# Patient Record
Sex: Female | Born: 1975 | Race: White | Hispanic: No | Marital: Married | Smoking: Never smoker
Health system: Southern US, Community
[De-identification: ages and names within clinical notes are randomized; demographics above are authoritative.]

## PROBLEM LIST (undated history)

## (undated) ENCOUNTER — Inpatient Hospital Stay (HOSPITAL_COMMUNITY): Payer: BC Managed Care – PPO

## (undated) DIAGNOSIS — IMO0001 Reserved for inherently not codable concepts without codable children: Secondary | ICD-10-CM

## (undated) DIAGNOSIS — K219 Gastro-esophageal reflux disease without esophagitis: Secondary | ICD-10-CM

---

## 2003-12-09 ENCOUNTER — Emergency Department (HOSPITAL_COMMUNITY): Admission: EM | Admit: 2003-12-09 | Discharge: 2003-12-09 | Payer: Self-pay | Admitting: Emergency Medicine

## 2004-01-16 ENCOUNTER — Encounter: Admission: RE | Admit: 2004-01-16 | Discharge: 2004-01-16 | Payer: Self-pay | Admitting: Family Medicine

## 2010-09-01 ENCOUNTER — Inpatient Hospital Stay (INDEPENDENT_AMBULATORY_CARE_PROVIDER_SITE_OTHER)
Admission: RE | Admit: 2010-09-01 | Discharge: 2010-09-01 | Disposition: A | Discharge status: Home / Self Care | Payer: BC Managed Care – PPO | Source: Ambulatory Visit | Attending: Family Medicine | Admitting: Family Medicine

## 2010-09-01 ENCOUNTER — Encounter: Payer: Self-pay | Admitting: Family Medicine

## 2010-09-01 DIAGNOSIS — R197 Diarrhea, unspecified: Secondary | ICD-10-CM

## 2010-10-04 ENCOUNTER — Encounter: Payer: Self-pay | Admitting: Family Medicine

## 2010-10-04 ENCOUNTER — Inpatient Hospital Stay (INDEPENDENT_AMBULATORY_CARE_PROVIDER_SITE_OTHER)
Admission: RE | Admit: 2010-10-04 | Discharge: 2010-10-04 | Disposition: A | Discharge status: Home / Self Care | Payer: BC Managed Care – PPO | Source: Ambulatory Visit | Attending: Family Medicine | Admitting: Family Medicine

## 2010-10-04 DIAGNOSIS — J019 Acute sinusitis, unspecified: Secondary | ICD-10-CM | POA: Insufficient documentation

## 2010-10-12 ENCOUNTER — Encounter: Payer: Self-pay | Admitting: Family Medicine

## 2010-10-12 ENCOUNTER — Inpatient Hospital Stay (INDEPENDENT_AMBULATORY_CARE_PROVIDER_SITE_OTHER)
Admission: RE | Admit: 2010-10-12 | Discharge: 2010-10-12 | Disposition: A | Discharge status: Home / Self Care | Payer: BC Managed Care – PPO | Source: Ambulatory Visit | Attending: Family Medicine | Admitting: Family Medicine

## 2010-10-12 ENCOUNTER — Ambulatory Visit
Admission: RE | Admit: 2010-10-12 | Discharge: 2010-10-12 | Disposition: A | Discharge status: Home / Self Care | Payer: BC Managed Care – PPO | Source: Ambulatory Visit | Attending: Family Medicine | Admitting: Family Medicine

## 2010-10-12 ENCOUNTER — Other Ambulatory Visit: Payer: Self-pay | Admitting: Family Medicine

## 2010-10-12 DIAGNOSIS — R0989 Other specified symptoms and signs involving the circulatory and respiratory systems: Secondary | ICD-10-CM

## 2010-10-12 DIAGNOSIS — J209 Acute bronchitis, unspecified: Secondary | ICD-10-CM

## 2010-11-06 ENCOUNTER — Encounter: Payer: Self-pay | Admitting: Emergency Medicine

## 2010-11-06 ENCOUNTER — Inpatient Hospital Stay (INDEPENDENT_AMBULATORY_CARE_PROVIDER_SITE_OTHER)
Admission: RE | Admit: 2010-11-06 | Discharge: 2010-11-06 | Disposition: A | Discharge status: Home / Self Care | Payer: BC Managed Care – PPO | Source: Ambulatory Visit | Attending: Emergency Medicine | Admitting: Emergency Medicine

## 2010-11-06 DIAGNOSIS — R059 Cough, unspecified: Secondary | ICD-10-CM

## 2010-11-06 DIAGNOSIS — R05 Cough: Secondary | ICD-10-CM

## 2010-11-06 DIAGNOSIS — J309 Allergic rhinitis, unspecified: Secondary | ICD-10-CM

## 2010-11-11 ENCOUNTER — Encounter: Payer: Self-pay | Admitting: Family Medicine

## 2010-11-16 ENCOUNTER — Ambulatory Visit (INDEPENDENT_AMBULATORY_CARE_PROVIDER_SITE_OTHER): Payer: BC Managed Care – PPO | Admitting: Family Medicine

## 2010-11-16 ENCOUNTER — Encounter: Payer: Self-pay | Admitting: Family Medicine

## 2010-11-16 VITALS — BP 149/100 | HR 98 | Temp 98.0°F | Ht 63.0 in | Wt 192.0 lb

## 2010-11-16 DIAGNOSIS — J329 Chronic sinusitis, unspecified: Secondary | ICD-10-CM

## 2010-11-16 MED ORDER — METHYLPREDNISOLONE ACETATE 40 MG/ML IJ SUSP
40.0000 mg | Freq: Once | INTRAMUSCULAR | Status: AC
  Administered 2010-11-16: 40 mg via INTRAMUSCULAR

## 2010-11-16 NOTE — Patient Instructions (Signed)
Try the nexium or protonix once a day about 20-30 min before breakfast.   Call me one week.   Acid Reflux (GERD) Acid reflux is also called gastroesophageal reflux disease (GERD). Your stomach makes acid to help digest food. Acid reflux happens when acid from your stomach goes into the tube between your mouth and stomach (esophagus). Your stomach is protected from the acid, but this tube is not. When acid gets into the tube, it may cause a burning feeling in the chest (heartburn). Besides heartburn, other health problems can happen if the acid keeps going into the tube. Some causes of acid reflux include:  Being overweight.   Smoking.   Drinking alcohol.   Eating large meals.   Eating meals and then going to bed right away.   Eating certain foods.   Increased stomach acid production.  HOME CARE  Take all medicine as told by your doctor.   You may need to:   Lose weight.   Avoid alcohol.   Quit smoking.   Do not eat big meals. It is better to eat smaller meals throughout the day.   Do not eat a meal and then nap or go to bed.   Sleep with your head higher than your stomach.   Avoid foods that bother you.   You may need more tests, or you may need to see a special doctor.  GET HELP RIGHT AWAY IF:  You have chest pain that is different than before.   You have pain that goes to your arms, jaw, or between your shoulder blades.   You throw up (vomit) blood, dark brown liquid, or your throw up looks like coffee grounds.   You have trouble swallowing.   You have trouble breathing or cannot stop coughing.   You feel dizzy or pass out.   Your skin is cool, wet, and pale.   Your medicine is not helping.  MAKE SURE YOU:    Understand these instructions.   Will watch your condition.   Will get help right away if you are not doing well or get worse.  Document Released: 08/04/2007 Document Re-Released: 05/12/2009 [facility] Patient Information 2011 [place], [place].

## 2010-11-16 NOTE — Progress Notes (Signed)
Subjective:    Patient ID: Shannon Clarke, female    DOB: June 21, 1975, 35 y.o.   MRN: [id number]  HPI Cough for 2 months. Started with a sinus infection and was on  ABX.  But now still has a cough. Has hydrocodone cough medicine.  Mold in her building.  She gets a tightnesss in her mid chest and radiates into her back.  Was again at [facility] last Friday because cough flares.  Did well as far a her sinus pressure and pain until last night. Neg CXR.  NO hx of phlegm. Gets reflux symptoms about once a week.    Review of Systems  Constitutional: Negative for fever, diaphoresis and unexpected weight change.  HENT: Negative for hearing loss, rhinorrhea and tinnitus.   Eyes: Negative for visual disturbance.  Respiratory: Positive for cough. Negative for wheezing.   Cardiovascular: Positive for chest pain. Negative for palpitations.  Gastrointestinal: Negative for nausea, vomiting, diarrhea and blood in stool.  Genitourinary: Negative for vaginal bleeding, vaginal discharge and difficulty urinating.  Musculoskeletal: Negative for myalgias and arthralgias.  Skin: Negative for rash.  Neurological: Negative for headaches.  Hematological: Negative for adenopathy. Does not bruise/bleed easily.  Psychiatric/Behavioral: Negative for sleep disturbance and dysphoric mood. The patient is not nervous/anxious.        BP 149/100  Pulse 98  Temp(Src) 98 F (36.7 C) (Oral)  Ht 5\' 3"  (1.6 m)  Wt 192 lb (87.091 kg)  BMI 34.01 kg/m2    No Known Allergies  History reviewed. No pertinent past medical history.  History reviewed. No pertinent past surgical history.  History   Social History  . Marital Status: Married    Spouse Name: Chrissie Noa    Number of Children: N/A  . Years of Education: N/A   Occupational History  . HR manager      [employer]   Social History Main Topics  . Smoking status: Never Smoker   . Smokeless tobacco: Not on file  . Alcohol Use: No  . Drug Use: No  . Sexually  Active: Yes -- Female partner(s)   Other Topics Concern  . Not on file   Social History Narrative   No caffeine, no regular exercise.     Family History  Problem Relation Age of Onset  . Hypertension Mother   . Diabetes Maternal Grandmother   . Colon cancer Father 22  . Heart attack Maternal Grandfather   . Heart attack Maternal Grandmother     We administered methylPREDNISolone acetate.  Objective:   Physical Exam  Constitutional: She is oriented to person, place, and time. She appears well-developed and well-nourished.  HENT:  Head: Normocephalic and atraumatic.  Right Ear: External ear normal.  Left Ear: External ear normal.  Nose: Nose normal.  Mouth/Throat: Oropharynx is clear and moist.       TMs and canals are clear.   Eyes: Conjunctivae and EOM are normal. Pupils are equal, round, and reactive to light.  Neck: Neck supple. No thyromegaly present.  Cardiovascular: Normal rate, regular rhythm and normal heart sounds.   Pulmonary/Chest: Breath sounds normal. She has no wheezes.  Lymphadenopathy:    She has no cervical adenopathy.  Neurological: She is alert and oriented to person, place, and time.  Skin: Skin is warm and dry.  Psychiatric: She has a normal mood and affect.          Assessment & Plan:  Cough x 2 months - Recent nl CXR.  Will treat for reflux and  see is sxs improve. Pt has some nexium already at home (hasn't taken it). Call in one week if not better.  Consider 2nd round of ABX, presuming her sinusitis may not be fully cleared, if she is not better on her reflux meds. She never took the zantac,.   Gets flu vaccine through work.

## 2010-11-25 ENCOUNTER — Other Ambulatory Visit: Payer: Self-pay | Admitting: *Deleted

## 2010-11-25 MED ORDER — PANTOPRAZOLE SODIUM 40 MG PO TBEC: 40.0000 mg | DELAYED_RELEASE_TABLET | Freq: Every day | ORAL | Status: DC

## 2011-02-01 NOTE — Progress Notes (Signed)
Summary: Chest Congestion (room 4)   Vital Signs:  Patient Profile:   35 Years Old Female CC:      deep chest congestion; unrelieved since 10-04-10 visit Height:     63 inches Weight:      192.50 pounds O2 Sat:      98 % O2 treatment:    Room Air Temp:     98.2 degrees F oral Pulse rate:   87 / minute Resp:     16 per minute BP sitting:   133 / 83  (left arm) Cuff size:   large  Pt. in pain?   no  Vitals Entered By: Lavell Islam RN (October 12, 2010 8:23 PM)                   Prior Medication List:  ZANTAC 150 MG TABS (RANITIDINE HCL)  AUGMENTIN 875-125 MG TABS (AMOXICILLIN-POT CLAVULANATE) 1 by mouth 2 times daily ALLEGRA-D ALLERGY & CONGESTION 180-240 MG XR24H-TAB (FEXOFENADINE-PSEUDOEPHEDRINE) 1 by mouth q day   Updated Prior Medication List: ZANTAC 150 MG TABS (RANITIDINE HCL)  AUGMENTIN 875-125 MG TABS (AMOXICILLIN-POT CLAVULANATE) 1 by mouth 2 times daily ALLEGRA-D ALLERGY & CONGESTION 180-240 MG XR24H-TAB (FEXOFENADINE-PSEUDOEPHEDRINE) 1 by mouth q day  Current Allergies: No known allergies History of Present Illness Chief Complaint: deep chest congestion; unrelieved since 10-04-10 visit History of Present Illness: Patient w/ adeep cough. Treated for a sinus infection on Sunday 10/04/2010. States still coughing and bringup heavy thick material. Occassional bronchospasm . Still on her Augmentin and the sinus drainage has cleared.   Current Problems: BRONCHITIS, ACUTE (ICD-466.0) ACUTE SINUSITIS, UNSPECIFIED (ICD-461.9)   Current Meds ZANTAC 150 MG TABS (RANITIDINE HCL)  AUGMENTIN 875-125 MG TABS (AMOXICILLIN-POT CLAVULANATE) 1 by mouth 2 times daily ALLEGRA-D ALLERGY & CONGESTION 180-240 MG XR24H-TAB (FEXOFENADINE-PSEUDOEPHEDRINE) 1 by mouth q day ZITHROMAX Z-PAK 250 MG  TABS (AZITHROMYCIN) Use as directed TUSSIONEX PENNKINETIC ER 8-10 MG/5ML LQCR (CHLORPHENIRAMINE-HYDROCODONE) 1 tsp by mouth twice aday  REVIEW OF SYSTEMS Constitutional Symptoms  Complains of fever.     Denies chills, night sweats, weight loss, weight gain, and fatigue.  Eyes       Denies change in vision, eye pain, eye discharge, glasses, contact lenses, and eye surgery. Ear/Nose/Throat/Mouth       Denies hearing loss/aids, change in hearing, ear pain, ear discharge, dizziness, frequent runny nose, frequent nose bleeds, sinus problems, sore throat, hoarseness, and tooth pain or bleeding.  Respiratory       Complains of dry cough.      Denies productive cough, wheezing, shortness of breath, asthma, bronchitis, and emphysema/COPD.      Comments: chest congestion and pain with coughing Cardiovascular       Denies murmurs, chest pain, and tires easily with exhertion.    Gastrointestinal       Denies stomach pain, nausea/vomiting, diarrhea, constipation, blood in bowel movements, and indigestion. Genitourniary       Denies painful urination, kidney stones, and loss of urinary control. Neurological       Denies paralysis, seizures, and fainting/blackouts. Musculoskeletal       Denies muscle pain, joint pain, joint stiffness, decreased range of motion, redness, swelling, muscle weakness, and gout.  Skin       Denies bruising, unusual mles/lumps or sores, and hair/skin or nail changes.  Psych       Denies mood changes, temper/anger issues, anxiety/stress, speech problems, depression, and sleep problems. Other Comments: chest congestion and painful cough x 2-3 weeks despite ABX  Past History:  Family History: Last updated: 09/01/2010 Mother, HTN Father, D, Colon CA age 2  Social History: Last updated: 09/01/2010 No ETOH Non smoker No Drugs [facility]  Past Medical History: Reviewed history from 09/01/2010 and no changes required. Unremarkable  Past Surgical History: Reviewed history from 09/01/2010 and no changes required. Denies surgical history  Family History: Reviewed history from 09/01/2010 and no changes required. Mother, HTN Father, D,  Colon CA age 41  Social History: Reviewed history from 09/01/2010 and no changes required. No ETOH Non smoker No Drugs [facility] Physical Exam General appearance: well developed, well nourished, no acute distress Head: normocephalic, atraumatic Chest/Lungs: no rales, wheezes, or rhonchi bilateral, breath sounds equal without effort Heart: regular rate and  rhythm, no murmur Skin: no obvious rashes or lesions MSE: oriented to time, place, and person Assessment Problems:   ACUTE SINUSITIS, UNSPECIFIED (ICD-461.9) New Problems: BRONCHITIS, ACUTE (ICD-466.0)   Patient Education: Patient and/or caregiver instructed in the following: rest fluids and Tylenol.  Plan New Medications/Changes: Sandria Senter ER 8-10 MG/5ML LQCR (CHLORPHENIRAMINE-HYDROCODONE) 1 tsp by mouth twice aday  #4 fl oz x 0, 10/12/2010, Hassan Rowan MD ZITHROMAX Z-PAK 250 MG  TABS (AZITHROMYCIN) Use as directed  #1 x 0, 10/12/2010, Hassan Rowan MD  New Orders: Est. Patient Level III [16109] T-Chest x-ray, 2 views [71020] Follow Up: Follow up in 2-3 days if no improvement, Follow up on an as needed basis, Follow up with Primary Physician  The patient and/or caregiver has been counseled thoroughly with regard to medications prescribed including dosage, schedule, interactions, rationale for use, and possible side effects and they verbalize understanding.  Diagnoses and expected course of recovery discussed and will return if not improved as expected or if the condition worsens. Patient and/or caregiver verbalized understanding.  Prescriptions: Sandria Senter ER 8-10 MG/5ML LQCR (CHLORPHENIRAMINE-HYDROCODONE) 1 tsp by mouth twice aday  #4 fl oz x 0   Entered and Authorized by:   Hassan Rowan MD   Signed by:   Hassan Rowan MD on 10/12/2010   Method used:   Print then Give to Patient   RxID:   [id number] Z-PAK 250 MG  TABS (AZITHROMYCIN) Use as directed  #1 x 0   Entered and Authorized  by:   Hassan Rowan MD   Signed by:   Hassan Rowan MD on 10/12/2010   Method used:   Print then Give to Patient   RxID:   [id number]   Patient Instructions: 1)  Please schedule a follow-up appointment as needed.3-5 days if not improving 2)  Please schedule an appointment with your primary doctor in : 3)  Take your antibiotic as prescribed until ALL of it is gone, but stop if you develop a rash or swelling and contact our office as soon as possible. 4)  Acute bronchitis symptoms for less than 10 days are not helped by antibiotics. take over the counter cough medications. call if no improvment in  5-7 days, sooner if increasing cough, fever, or new symptoms( shortness of breath, chest pain).  Orders Added: 1)  Est. Patient Level III [44010] 2)  T-Chest x-ray, 2 views [71020]

## 2011-02-01 NOTE — Progress Notes (Signed)
Summary: STOMACH PROBLEMS   Vital Signs:  Patient Profile:   35 Years Old Female CC:      Diarrhea 10 days ago for 3 days then again yesterday LMP:     08/13/2010 Height:     63 inches Weight:      194 pounds O2 Sat:      98 % O2 treatment:    Room Air Temp:     98.6 degrees F oral Pulse rate:   99 / minute Pulse rhythm:   regular Resp:     16 per minute BP sitting:   130 / 94  (left arm) Cuff size:   large  Vitals Entered By: Emilio Math (September 01, 2010 7:21 PM)  Menstrual History: LMP (date): 08/13/2010                History of Present Illness Chief Complaint: Diarrhea 10 days ago for 3 days then again yesterday History of Present Illness:  Subjective:  Patient complains of developing watery diarrhea about 2 weeks ago after eating [place] food that lasted 3 days before resolving.  She had mild nausea but no vomiting.  No abdominal pain.  No fevers, chills, and sweats.  Her bowel movements have become more formed, although still soft, until yesterday when she had several episodes of watery diarrhea.  No diarrhea today.  Mild nausea without vomiting.  She has been fatigued today but no abdominal pain or fever.  No blood in stool.  No resp or GU symptoms.  No recent antibiotic use. No foreign travel.  No untreated water.  She notes that she has been eating cereal with milk over the past several days. Her father had colon cancer.  REVIEW OF SYSTEMS Constitutional Symptoms      Denies fever, chills, night sweats, weight loss, weight gain, and fatigue.  Eyes       Denies change in vision, eye pain, eye discharge, glasses, contact lenses, and eye surgery. Ear/Nose/Throat/Mouth       Denies hearing loss/aids, change in hearing, ear pain, ear discharge, dizziness, frequent runny nose, frequent nose bleeds, sinus problems, sore throat, hoarseness, and tooth pain or bleeding.  Respiratory       Denies dry cough, productive cough, wheezing, shortness of breath, asthma, bronchitis,  and emphysema/COPD.  Cardiovascular       Denies murmurs, chest pain, and tires easily with exhertion.    Gastrointestinal       Complains of stomach pain and diarrhea.      Denies nausea/vomiting, constipation, blood in bowel movements, and indigestion. Genitourniary       Denies painful urination, kidney stones, and loss of urinary control. Neurological       Denies paralysis, seizures, and fainting/blackouts. Musculoskeletal       Denies muscle pain, joint pain, joint stiffness, decreased range of motion, redness, swelling, muscle weakness, and gout.  Skin       Denies bruising, unusual mles/lumps or sores, and hair/skin or nail changes.  Psych       Denies mood changes, temper/anger issues, anxiety/stress, speech problems, depression, and sleep problems.  Past History:  Past Medical History: Unremarkable  Past Surgical History: Denies surgical history  Family History: Mother, HTN Father, D, Colon CA age 5  Social History: No ETOH Non smoker No Drugs [facility]   Objective:  Appearance:  Patient is obese but otherwise appears healthy, stated age, and in no acute distress  Mouth/pharynx:  moist mucous membranes  Neck:  Supple.  No adenopathy is present.  No thyromegaly is present  Lungs:  Clear to auscultation.  Breath sounds are equal.  Heart:  Regular rate and rhythm without murmurs, rubs, or gallops.  Abdomen:  Soft, nontender without masses or hepatosplenomegaly.  Bowel sounds are present.  No CVA or flank tenderness.  Extremities:  No edema.   Skin:  No rash CBC:  WBC 9.3 ; LY 29.0, MO 6.4, GR 64.6; Hgb 15.4  Assessment New Problems: DIARRHEA, ACUTE (ICD-787.91)  SUSPECT A RESOLVING VIRAL GASTROENTERITIS, AND RECURRENT DIARRHEA INITIATED BY MILK INGESTION  Plan New Orders: CBC w/Diff [16109-60454] Services provided After hours-Weekends-Holidays [99051] New Patient Level IV [99204] Planning Comments:   Treat symptomatically for now:  clear liquids  for about 12 hours, then slowly advance diet.  Avoid mild products.  May take Imodium when stools become less watery. If diarrhea persists or develops fever, etc, will need stool studies.   The patient and/or caregiver has been counseled thoroughly with regard to medications prescribed including dosage, schedule, interactions, rationale for use, and possible side effects and they verbalize understanding.  Diagnoses and expected course of recovery discussed and will return if not improved as expected or if the condition worsens. Patient and/or caregiver verbalized understanding.   Orders Added: 1)  CBC w/Diff [09811-91478] 2)  Services provided After hours-Weekends-Holidays [99051] 3)  New Patient Level IV [29562]

## 2011-02-01 NOTE — Progress Notes (Signed)
Summary: cough Room 5   Vital Signs:  Patient Profile:   35 Years Old Female CC:      Cough x 6-7 wks worse last night Height:     63 inches Weight:      190 pounds O2 Sat:      96 % O2 treatment:    Room Air Temp:     98.9 degrees F oral Pulse rate:   89 / minute Pulse rhythm:   regular Resp:     16 per minute BP sitting:   145 / 96  (left arm) Cuff size:   regular  Vitals Entered By: Emilio Math (November 06, 2010 11:58 AM)                  Current Allergies: No known allergies History of Present Illness History from: patient & husband Chief Complaint: Cough x 6-7 wks worse last night History of Present Illness: Cough for 6 weeks.  Took Augmentin and Zpak and got better.  Still with occasional hacking cough, last night was worse.  No other URI symptoms (fever, chills) other than post nasal drip.  They have been around their dog and a cat.  She has mild allergic symptoms occasionally.  Her CXR last month was negative.   Current Meds ZANTAC 150 MG TABS (RANITIDINE HCL)  TESSALON 200 MG CAPS (BENZONATATE) 1 by mouth up to three times a day as needed for cough  REVIEW OF SYSTEMS Constitutional Symptoms      Denies fever, chills, night sweats, weight loss, weight gain, and fatigue.  Eyes       Denies change in vision, eye pain, eye discharge, glasses, contact lenses, and eye surgery. Ear/Nose/Throat/Mouth       Denies hearing loss/aids, change in hearing, ear pain, ear discharge, dizziness, frequent runny nose, frequent nose bleeds, sinus problems, sore throat, hoarseness, and tooth pain or bleeding.  Respiratory       Complains of dry cough.      Denies productive cough, wheezing, shortness of breath, asthma, bronchitis, and emphysema/COPD.  Cardiovascular       Denies murmurs, chest pain, and tires easily with exhertion.    Gastrointestinal       Denies stomach pain, nausea/vomiting, diarrhea, constipation, blood in bowel movements, and indigestion. Genitourniary       Denies painful urination, kidney stones, and loss of urinary control. Neurological       Denies paralysis, seizures, and fainting/blackouts. Musculoskeletal       Denies muscle pain, joint pain, joint stiffness, decreased range of motion, redness, swelling, muscle weakness, and gout.  Skin       Denies bruising, unusual mles/lumps or sores, and hair/skin or nail changes.  Psych       Denies mood changes, temper/anger issues, anxiety/stress, speech problems, depression, and sleep problems.  Past History:  Past Medical History: Reviewed history from 09/01/2010 and no changes required. Unremarkable  Past Surgical History: Reviewed history from 09/01/2010 and no changes required. Denies surgical history  Family History: Reviewed history from 09/01/2010 and no changes required. Mother, HTN Father, D, Colon CA age 30  Social History: Reviewed history from 09/01/2010 and no changes required. No ETOH Non smoker No Drugs [facility] Physical Exam General appearance: well developed, well nourished, no acute distress Ears: normal, no lesions or deformities Nasal: mucosa pink, nonedematous, no septal deviation, turbinates normal Oral/Pharynx: clear PND, no erythema, no exudate Neck: no LAD Chest/Lungs: no rales, wheezes, or rhonchi bilateral, breath sounds equal  without effort Heart: regular rate and  rhythm, no murmur MSE: oriented to time, place, and person Assessment New Problems: COUGH (ICD-786.2) ALLERGIC RHINITIS (ICD-477.9)   Plan New Medications/Changes: TESSALON 200 MG CAPS (BENZONATATE) 1 by mouth up to three times a day as needed for cough  #30 x 0, 11/06/2010, Hoyt Koch MD  New Orders: Est. Patient Level IV [16109] Pulse Oximetry (single measurment) [60454] Planning Comments:   She has Flonase at home.  Also on Prilosec for mild GERD.  Suggest that + OTC antihistamine.  Will give Tessalon as well.  If she needs Flonase refill, should call us.  I  also gave her card for Dr. Linford Arnold to establish in 1-2 months for well-woman and PCP services.  I don't feel she has sinusitis or other infectious cause of cough.  Hydration.  If continues, consider allergist w/u.   The patient and/or caregiver has been counseled thoroughly with regard to medications prescribed including dosage, schedule, interactions, rationale for use, and possible side effects and they verbalize understanding.  Diagnoses and expected course of recovery discussed and will return if not improved as expected or if the condition worsens. Patient and/or caregiver verbalized understanding.  Prescriptions: TESSALON 200 MG CAPS (BENZONATATE) 1 by mouth up to three times a day as needed for cough  #30 x 0   Entered and Authorized by:   Hoyt Koch MD   Signed by:   Hoyt Koch MD on 11/06/2010   Method used:   Print then Give to Patient   RxID:   [id number]   Orders Added: 1)  Est. Patient Level IV [30865] 2)  Pulse Oximetry (single measurment) [78469]

## 2011-02-01 NOTE — Progress Notes (Signed)
Summary: CHEST CONGESTION (room 5)   Vital Signs:  Patient Profile:   35 Years Old Female CC:      URI and chest congestion x 2 weeks Height:     63 inches Weight:      193.25 pounds O2 Sat:      99 % O2 treatment:    Room Air Temp:     98.1 degrees F oral Pulse rate:   80 / minute Resp:     16 per minute BP sitting:   135 / 93  (left arm) Cuff size:   large  Pt. in pain?   no  Vitals Entered By: Lavell Islam RN (October 04, 2010 3:57 PM)                   Prior Medication List:  ZANTAC 150 MG TABS (RANITIDINE HCL)    Updated Prior Medication List: ZANTAC 150 MG TABS (RANITIDINE HCL)   Current Allergies: No known allergies History of Present Illness Chief Complaint: URI and chest congestion x 2 weeks History of Present Illness: Patient with nasal congestion and coughtf for two weeks. She has had sinus pressure and productive sputum. At night she states it seems in her head and as the day goes on it moves into her chest.   Current Problems: ACUTE SINUSITIS, UNSPECIFIED (ICD-461.9)   Current Meds ZANTAC 150 MG TABS (RANITIDINE HCL)  AUGMENTIN 875-125 MG TABS (AMOXICILLIN-POT CLAVULANATE) 1 by mouth 2 times daily ALLEGRA-D ALLERGY & CONGESTION 180-240 MG XR24H-TAB (FEXOFENADINE-PSEUDOEPHEDRINE) 1 by mouth q day  REVIEW OF SYSTEMS Constitutional Symptoms      Denies fever, chills, night sweats, weight loss, weight gain, and fatigue.  Eyes       Denies change in vision, eye pain, eye discharge, glasses, contact lenses, and eye surgery. Ear/Nose/Throat/Mouth       Complains of ear pain, sinus problems, and hoarseness.      Denies hearing loss/aids, change in hearing, ear discharge, dizziness, frequent runny nose, frequent nose bleeds, sore throat, and tooth pain or bleeding.  Respiratory       Complains of dry cough and productive cough.      Denies wheezing, shortness of breath, asthma, bronchitis, and emphysema/COPD.  Cardiovascular       Complains of chest  pain.      Denies murmurs and tires easily with exhertion.      Comments: congestion   Gastrointestinal       Denies stomach pain, nausea/vomiting, diarrhea, constipation, blood in bowel movements, and indigestion. Genitourniary       Denies painful urination, kidney stones, and loss of urinary control. Neurological       Complains of headaches.      Denies paralysis, seizures, and fainting/blackouts. Musculoskeletal       Complains of muscle pain and joint pain.      Denies joint stiffness, decreased range of motion, redness, swelling, muscle weakness, and gout.      Comments: general Skin       Denies bruising, unusual mles/lumps or sores, and hair/skin or nail changes.  Psych       Denies mood changes, temper/anger issues, anxiety/stress, speech problems, depression, and sleep problems. Other Comments: URI and chest congestion x 2 weeks   Past History:  Family History: Last updated: 09/01/2010 Mother, HTN Father, D, Colon CA age 91  Social History: Last updated: 09/01/2010 No ETOH Non smoker No Drugs [facility]  Past Medical History: Reviewed history from 09/01/2010 and no  changes required. Unremarkable  Past Surgical History: Reviewed history from 09/01/2010 and no changes required. Denies surgical history  Family History: Reviewed history from 09/01/2010 and no changes required. Mother, HTN Father, D, Colon CA age 43  Social History: Reviewed history from 09/01/2010 and no changes required. No ETOH Non smoker No Drugs [facility] Physical Exam General appearance: well developed, well nourished, no acute distress Head: normocephalic, atraumatic Ears: normal, no lesions or deformities Nasal: pale, boggy, swollen nasal turbinates Oral/Pharynx: tongue normal, posterior pharynx without erythema or exudate Neck: supple,anterior lymphadenopathy present Chest/Lungs: no rales, wheezes, or rhonchi bilateral, breath sounds equal without effort Heart: regular  rate and  rhythm, no murmur Skin: no obvious rashes or lesions MSE: oriented to time, place, and person Assessment New Problems: ACUTE SINUSITIS, UNSPECIFIED (ICD-461.9)   Patient Education: Patient and/or caregiver instructed in the following: rest fluids and Tylenol.  Plan New Medications/Changes: ALLEGRA-D ALLERGY & CONGESTION 180-240 MG XR24H-TAB (FEXOFENADINE-PSEUDOEPHEDRINE) 1 by mouth q day  #30 x 0, 10/04/2010, Hassan Rowan MD AUGMENTIN [phone] MG TABS (AMOXICILLIN-POT CLAVULANATE) 1 by mouth 2 times daily  #20 x 0, 10/04/2010, Hassan Rowan MD  New Orders: Est. Patient Level III [phone] Planning Comments:   use flonase at home as well   The patient and/or caregiver has been counseled thoroughly with regard to medications prescribed including dosage, schedule, interactions, rationale for use, and possible side effects and they verbalize understanding.  Diagnoses and expected course of recovery discussed and will return if not improved as expected or if the condition worsens. Patient and/or caregiver verbalized understanding.  Prescriptions: ALLEGRA-D ALLERGY & CONGESTION 180-240 MG XR24H-TAB (FEXOFENADINE-PSEUDOEPHEDRINE) 1 by mouth q day  #30 x 0   Entered and Authorized by:   Hassan Rowan MD   Signed by:   Hassan Rowan MD on 10/04/2010   Method used:   Print then Give to Patient   RxID:   [id number] AUGMENTIN 875-125 MG TABS (AMOXICILLIN-POT CLAVULANATE) 1 by mouth 2 times daily  #20 x 0   Entered and Authorized by:   Hassan Rowan MD   Signed by:   Hassan Rowan MD on 10/04/2010   Method used:   Print then Give to Patient   RxID:   [id number]   Patient Instructions: 1)  Please schedule a follow-up appointment as needed. 2)  Please schedule an appointment with your primary doctor in :7-20 days if needed 3)  Take your antibiotic as prescribed until ALL of it is gone, but stop if you develop a rash or swelling and contact our office as soon as possible. 4)  Acute  sinusitis symptoms for less than 10 days are not helped by antibiotics.Use warm moist compresses, and over the counter decongestants ( only as directed). Call if no improvement in 5-7 days, sooner if increasing pain, fever, or new symptoms.  Orders Added: 1)  Est. Patient Level III [13244]

## 2011-03-01 ENCOUNTER — Other Ambulatory Visit: Payer: Self-pay | Admitting: *Deleted

## 2011-03-01 MED ORDER — PANTOPRAZOLE SODIUM 40 MG PO TBEC: 40.0000 mg | DELAYED_RELEASE_TABLET | Freq: Every day | ORAL | Status: DC

## 2011-04-23 ENCOUNTER — Other Ambulatory Visit: Payer: Self-pay | Admitting: *Deleted

## 2011-04-23 MED ORDER — PANTOPRAZOLE SODIUM 40 MG PO TBEC: 40.0000 mg | DELAYED_RELEASE_TABLET | Freq: Every day | ORAL | Status: DC

## 2011-08-03 ENCOUNTER — Telehealth: Payer: Self-pay | Admitting: *Deleted

## 2011-08-03 MED ORDER — PANTOPRAZOLE SODIUM 40 MG PO TBEC: 40.0000 mg | DELAYED_RELEASE_TABLET | Freq: Every day | ORAL | Status: DC

## 2011-08-03 NOTE — Telephone Encounter (Signed)
Pt called and wanted a year supply of protonix faxed to [facility] because she can get it cheaper there

## 2011-10-14 ENCOUNTER — Ambulatory Visit (INDEPENDENT_AMBULATORY_CARE_PROVIDER_SITE_OTHER): Payer: BC Managed Care – PPO | Admitting: Family Medicine

## 2011-10-14 ENCOUNTER — Encounter: Payer: Self-pay | Admitting: Family Medicine

## 2011-10-14 VITALS — BP 147/98 | HR 100 | Temp 97.6°F | Ht 63.0 in | Wt 196.0 lb

## 2011-10-14 DIAGNOSIS — R1901 Right upper quadrant abdominal swelling, mass and lump: Secondary | ICD-10-CM

## 2011-10-14 DIAGNOSIS — R1011 Right upper quadrant pain: Secondary | ICD-10-CM

## 2011-10-14 DIAGNOSIS — R109 Unspecified abdominal pain: Secondary | ICD-10-CM

## 2011-10-14 LAB — POCT URINALYSIS DIPSTICK
Bilirubin, UA: NEGATIVE
Glucose, UA: NEGATIVE
Ketones, UA: NEGATIVE
Nitrite, UA: NEGATIVE
Spec Grav, UA: <=1.005
Urobilinogen, UA: 0.2
pH, UA: 5.5

## 2011-10-14 NOTE — Progress Notes (Signed)
Subjective:    Patient ID: Shannon Clarke, female    DOB: 04/20/1975, 36 y.o.   MRN: [id number]  HPI RUQ pain for one week.  Has been dong some packing and lifting.  Has been sore to touch.  Thinks may have pulled a muscle. No nausea.  No appetite changes.  No change in bowels. Still has a gallbladder.   Better with a muscle rub.  Better with sitting still.  No fever.  Worse like a pressure when she sits. Says feels tight.  Tried Tylenol and really does help.  Says it comes and goes, but not worse.  She is on a PPI.     Review of Systems BP 147/98  Pulse 100  Temp 97.6 F (36.4 C) (Oral)  Ht 5\' 3"  (1.6 m)  Wt 196 lb (88.905 kg)  BMI 34.72 kg/m2  SpO2 99%  LMP 10/14/2011    No Known Allergies  No past medical history on file.  No past surgical history on file.  History   Social History  . Marital Status: Married    Spouse Name: Chrissie Noa    Number of Children: N/A  . Years of Education: N/A   Occupational History  . HR manager      [employer]   Social History Main Topics  . Smoking status: Never Smoker   . Smokeless tobacco: Not on file  . Alcohol Use: No  . Drug Use: No  . Sexually Active: Yes -- Female partner(s)   Other Topics Concern  . Not on file   Social History Narrative   No caffeine, no regular exercise.     Family History  Problem Relation Age of Onset  . Hypertension Mother   . Diabetes Maternal Grandmother   . Colon cancer Father 47  . Heart attack Maternal Grandfather   . Heart attack Maternal Grandmother     Outpatient Encounter Prescriptions as of 10/14/2011  Medication Sig Dispense Refill  . pantoprazole (PROTONIX) 40 MG tablet Take 1 tablet (40 mg total) by mouth daily.  30 tablet  11  . DISCONTD: benzonatate (TESSALON) 200 MG capsule Take 200 mg by mouth 3 (three) times daily as needed.                Objective:   Physical Exam  Constitutional: She is oriented to person, place, and time. She appears well-developed and  well-nourished.  HENT:  Head: Normocephalic and atraumatic.  Cardiovascular: Normal rate, regular rhythm and normal heart sounds.   Pulmonary/Chest: Effort normal and breath sounds normal.  Abdominal: Soft. Bowel sounds are normal. She exhibits no distension and no mass. There is tenderness. There is no rebound and no guarding.       Tender in the RUQ along the rib edge.  No rebound. No skin changes. Dec BS.   Neurological: She is alert and oriented to person, place, and time.  Skin: Skin is warm and dry.  Psychiatric: She has a normal mood and affect. Her behavior is normal.          Assessment & Plan:  RUQ pain - I. think at this point we do need to rule out cholecystitis even though she has been afebrile and has not had nausea or decrease in appetite or change in bowels. Her urinalysis was positive for leukocytes and blood but she is currently on her period. Also consider musculoskeletal strain since she has been packing and moving. Continue Tylenol since it does seem to help relieve her pain.  Also heating pad. Work on gentle stretches. If her ultrasound is negative then call if her symptoms have not resolved in 2-3 weeks. We have called to schedule her ultrasound and is for tomorrow at 11 AM. That was the first available appointment. In the meantime we'll check a CBC, CMP to evaluate for liver enzymes as well as amylase.

## 2011-10-14 NOTE — Patient Instructions (Addendum)
The appointment for the ultrasound is downstairs at 10:45 AM tomorrow. Please make sure you have not had anything by mouth including food and drink for at least 6 hours prior to your appointment. Bring your insurance card with you.

## 2011-10-15 ENCOUNTER — Ambulatory Visit: Payer: BC Managed Care – PPO

## 2011-10-15 ENCOUNTER — Other Ambulatory Visit: Payer: Self-pay | Admitting: Family Medicine

## 2011-10-15 DIAGNOSIS — R109 Unspecified abdominal pain: Secondary | ICD-10-CM

## 2011-10-15 DIAGNOSIS — R1901 Right upper quadrant abdominal swelling, mass and lump: Secondary | ICD-10-CM

## 2011-10-15 LAB — COMPLETE METABOLIC PANEL WITH GFR
ALT: 13 U/L (ref 0–35)
AST: 15 U/L (ref 0–37)
Albumin: 4.8 g/dL (ref 3.5–5.2)
Alkaline Phosphatase: 75 U/L (ref 39–117)
BUN: 8 mg/dL (ref 6–23)
CO2: 24 mEq/L (ref 19–32)
Calcium: 9.9 mg/dL (ref 8.4–10.5)
Chloride: 105 mEq/L (ref 96–112)
Creat: 0.78 mg/dL (ref 0.50–1.10)
GFR, Est African American: >89 mL/min
GFR, Est Non African American: >89 mL/min
Glucose, Bld: 109 mg/dL — ABNORMAL HIGH (ref 70–99)
Potassium: 3.9 mEq/L (ref 3.5–5.3)
Sodium: 141 mEq/L (ref 135–145)
Total Bilirubin: 0.4 mg/dL (ref 0.3–1.2)
Total Protein: 7.2 g/dL (ref 6.0–8.3)

## 2011-10-15 LAB — CBC WITH DIFFERENTIAL/PLATELET
Basophils Absolute: 0 10*3/uL (ref 0.0–0.1)
Basophils Relative: 0 % (ref 0–1)
Eosinophils Absolute: 0 10*3/uL (ref 0.0–0.7)
Eosinophils Relative: 0 % (ref 0–5)
HCT: 43.4 % (ref 36.0–46.0)
Hemoglobin: 14.9 g/dL (ref 12.0–15.0)
Lymphocytes Relative: 22 % (ref 12–46)
Lymphs Abs: 1.7 10*3/uL (ref 0.7–4.0)
MCH: 28.5 pg (ref 26.0–34.0)
MCHC: 34.3 g/dL (ref 30.0–36.0)
MCV: 83 fL (ref 78.0–100.0)
Monocytes Absolute: 0.6 10*3/uL (ref 0.1–1.0)
Monocytes Relative: 8 % (ref 3–12)
Neutro Abs: 5.5 10*3/uL (ref 1.7–7.7)
Neutrophils Relative %: 70 % (ref 43–77)
Platelets: 375 10*3/uL (ref 150–400)
RBC: 5.23 MIL/uL — ABNORMAL HIGH (ref 3.87–5.11)
RDW: 13.5 % (ref 11.5–15.5)
WBC: 7.9 10*3/uL (ref 4.0–10.5)

## 2011-10-15 LAB — AMYLASE: Amylase: 26 U/L (ref 0–105)

## 2011-10-15 NOTE — Progress Notes (Signed)
Quick Note:  All labs are normal. ______ 

## 2011-10-15 NOTE — Addendum Note (Signed)
Addended by: Nani Gasser D on: 10/15/2011 07:43 AM   Modules accepted: Level of Service

## 2011-10-21 ENCOUNTER — Telehealth: Payer: Self-pay | Admitting: *Deleted

## 2011-10-21 ENCOUNTER — Other Ambulatory Visit (HOSPITAL_BASED_OUTPATIENT_CLINIC_OR_DEPARTMENT_OTHER): Payer: Self-pay

## 2011-10-21 NOTE — Telephone Encounter (Signed)
Shannon Clarke called to inform you that the pt has cancelled her ABD [place] for the 2nd time. States pt told her she will call back to schedule but she wanted to let you know why it had not been done yet.

## 2011-10-21 NOTE — Telephone Encounter (Signed)
Ok

## 2012-05-10 ENCOUNTER — Ambulatory Visit (INDEPENDENT_AMBULATORY_CARE_PROVIDER_SITE_OTHER): Payer: BC Managed Care – PPO | Admitting: Sports Medicine

## 2012-05-10 ENCOUNTER — Encounter: Payer: Self-pay | Admitting: Sports Medicine

## 2012-05-10 VITALS — BP 157/92 | HR 85 | Temp 97.8°F | Wt 200.0 lb

## 2012-05-10 DIAGNOSIS — J0100 Acute maxillary sinusitis, unspecified: Secondary | ICD-10-CM

## 2012-05-10 MED ORDER — AZITHROMYCIN 250 MG PO TABS: ORAL_TABLET | ORAL | Status: DC

## 2012-05-10 MED ORDER — FLUTICASONE PROPIONATE 50 MCG/ACT NA SUSP
NASAL | Status: DC
Start: 2012-05-10 — End: 2013-04-13

## 2012-05-10 NOTE — Progress Notes (Signed)
Subjective:    CC: Sick  HPI: Shannon Clarke has had several days of pain and pressure over her maxillary sinuses radiating to the ears and upper teeth, purulent nasal discharge, denies any cough or lower respiratory symptoms. Pain is moderate, radiates as above, and is worsening. Azithromycin in the past has worked well.  Past medical history, Surgical history, Family history not pertinant except as noted below, Social history, Allergies, and medications have been entered into the medical record, reviewed, and no changes needed.   Review of Systems: No fevers, chills, night sweats, weight loss, chest pain, or shortness of breath.   Objective:    General: Well Developed, well nourished, and in no acute distress.  Neuro: Alert and oriented x3, extra-ocular muscles intact, sensation grossly intact.  HEENT: Normocephalic, atraumatic, pupils equal round reactive to light, neck supple, no masses, no lymphadenopathy, thyroid nonpalpable. Tender to palpation over maxillary sinuses, external ear canal, oropharynx, nasopharynx overall look unremarkable. Skin: Warm and dry, no rashes. Cardiac: Regular rate and rhythm, no murmurs rubs or gallops.  Respiratory: Clear to auscultation bilaterally. Not using accessory muscles, speaking in full sentences. Impression and Recommendations:

## 2012-05-10 NOTE — Assessment & Plan Note (Signed)
Azithromycin, Flonase. Return as needed. 

## 2012-05-19 ENCOUNTER — Encounter: Payer: Self-pay | Admitting: Sports Medicine

## 2012-05-19 ENCOUNTER — Ambulatory Visit (INDEPENDENT_AMBULATORY_CARE_PROVIDER_SITE_OTHER): Payer: BC Managed Care – PPO | Admitting: Sports Medicine

## 2012-05-19 VITALS — BP 165/100 | HR 76 | Wt 198.0 lb

## 2012-05-19 DIAGNOSIS — H6990 Unspecified Eustachian tube disorder, unspecified ear: Secondary | ICD-10-CM

## 2012-05-19 DIAGNOSIS — I10 Essential (primary) hypertension: Secondary | ICD-10-CM | POA: Insufficient documentation

## 2012-05-19 DIAGNOSIS — R030 Elevated blood-pressure reading, without diagnosis of hypertension: Secondary | ICD-10-CM

## 2012-05-19 DIAGNOSIS — J0100 Acute maxillary sinusitis, unspecified: Secondary | ICD-10-CM | POA: Insufficient documentation

## 2012-05-19 DIAGNOSIS — H6980 Other specified disorders of Eustachian tube, unspecified ear: Secondary | ICD-10-CM

## 2012-05-19 DIAGNOSIS — H6992 Unspecified Eustachian tube disorder, left ear: Secondary | ICD-10-CM

## 2012-05-19 DIAGNOSIS — H6982 Other specified disorders of Eustachian tube, left ear: Secondary | ICD-10-CM

## 2012-05-19 NOTE — Progress Notes (Signed)
Subjective:    CC: Left ear pain  HPI: Shannon Clarke comes back, I just treated her for acute maxillary sinusitis, these symptoms have completely resolved. Unfortunately she develops a fullness that she localizes in her left ear it feels as though she's going on a plane and cannot pop her ears. She denies any other symptoms.  Past medical history, Surgical history, Family history not pertinant except as noted below, Social history, Allergies, and medications have been entered into the medical record, reviewed, and no changes needed.   Review of Systems: No fevers, chills, night sweats, weight loss, chest pain, or shortness of breath.   Objective:    General: Well Developed, well nourished, and in no acute distress.  Neuro: Alert and oriented x3, extra-ocular muscles intact, sensation grossly intact.  HEENT: Normocephalic, atraumatic, pupils equal round reactive to light, neck supple, no masses, no lymphadenopathy, thyroid nonpalpable. External ear canal, oropharynx unremarkable. Skin: Warm and dry, no rashes. Cardiac: Regular rate and rhythm, no murmurs rubs or gallops.  Respiratory: Clear to auscultation bilaterally. Not using accessory muscles, speaking in full sentences. Impression and Recommendations:

## 2012-05-19 NOTE — Assessment & Plan Note (Signed)
Recently treated with azithromycin for sinus infection. Decongestants and increase use of Flonase which she has not yet using. Return as needed.

## 2012-05-19 NOTE — Assessment & Plan Note (Signed)
Recheck in 2 weeks, she will likely call in some blood pressures from home.

## 2012-05-24 ENCOUNTER — Emergency Department
Admission: EM | Admit: 2012-05-24 | Discharge: 2012-05-24 | Disposition: A | Discharge status: Home / Self Care | Payer: Self-pay | Source: Home / Self Care | Attending: Family Medicine | Admitting: Family Medicine

## 2012-05-24 ENCOUNTER — Encounter: Payer: Self-pay | Admitting: *Deleted

## 2012-05-24 DIAGNOSIS — J069 Acute upper respiratory infection, unspecified: Secondary | ICD-10-CM

## 2012-05-24 HISTORY — DX: Gastro-esophageal reflux disease without esophagitis: K21.9

## 2012-05-24 HISTORY — DX: Reserved for inherently not codable concepts without codable children: IMO0001

## 2012-05-24 MED ORDER — AMOXICILLIN 500 MG PO CAPS: 500.0000 mg | ORAL_CAPSULE | Freq: Three times a day (TID) | ORAL | Status: DC

## 2012-05-24 MED ORDER — BENZONATATE 200 MG PO CAPS: 200.0000 mg | ORAL_CAPSULE | Freq: Every day | ORAL | Status: DC

## 2012-05-24 NOTE — ED Notes (Signed)
Ioanna reports sinus infection 3 weeks ago, finished antibiotic, she later developed ear pain, then this week developed productive cough and wheezing. Denies fever

## 2012-05-24 NOTE — ED Provider Notes (Signed)
History     CSN: [id number]  Arrival date & time 05/24/12  1347   First MD Initiated Contact with Patient 05/24/12 1410      Chief Complaint  Patient presents with  . Cough       HPI Comments: Patient complains of 2 to 3 day history of URI symptoms with sinus congestion, mild sore throat, and cough.  She feels tight in her anterior chest and has been wheezing at night.  No fevers, chills, and sweats. She states that she was treated for a sinus infection about 3 weeks ago with a Z-pack and Flonase nasal spray.  The history is provided by the patient.    Past Medical History  Diagnosis Date  . Reflux     History reviewed. No pertinent past surgical history.  Family History  Problem Relation Age of Onset  . Hypertension Mother   . Diabetes Maternal Grandmother   . Colon cancer Father 54  . Heart attack Maternal Grandfather   . Heart attack Maternal Grandmother     History  Substance Use Topics  . Smoking status: Never Smoker   . Smokeless tobacco: Not on file  . Alcohol Use: No    OB History   Grav Para Term Preterm Abortions TAB SAB Ect Mult Living                  Review of Systems + sore throat + cough No pleuritic pain + wheezing at neight + nasal congestion + post-nasal drainage No sinus pain/pressure No itchy/red eyes No earache No hemoptysis No SOB No fever/chills No nausea No vomiting No abdominal pain No diarrhea No urinary symptoms No skin rashes + fatigue No myalgias No headache Used OTC meds without relief  Allergies  Review of patient's allergies indicates no known allergies.  Home Medications   Current Outpatient Rx  Name  Route  Sig  Dispense  Refill  . amoxicillin (AMOXIL) 500 MG capsule   Oral   Take 1 capsule (500 mg total) by mouth 3 (three) times daily. (Rx void after 06/01/12)   30 capsule   0   . benzonatate (TESSALON) 200 MG capsule   Oral   Take 1 capsule (200 mg total) by mouth at bedtime.   12 capsule   0    . fluticasone (FLONASE) 50 MCG/ACT nasal spray      One spray in each nostril twice a day, use left hand for right nostril, and right hand for left nostril.   48 g   3   . pantoprazole (PROTONIX) 40 MG tablet   Oral   Take 1 tablet (40 mg total) by mouth daily.   30 tablet   11     BP 138/110  Pulse 149  Temp(Src) 98.2 F (36.8 C) (Oral)  Ht 5\' 3"  (1.6 m)  Wt 195 lb (88.451 kg)  BMI 34.55 kg/m2  SpO2 98%  Physical Exam Nursing notes and Vital Signs reviewed. Appearance:  Patient appears stated age, and in no acute distress. Patient is obese (BMI 34.6) Eyes:  Pupils are equal, round, and reactive to light and accomodation.  Extraocular movement is intact.  Conjunctivae are not inflamed  Ears:  Canals normal.  Tympanic membranes normal.  Nose:  Mildly congested turbinates.  No sinus tenderness.  Pharynx:  Normal Neck:  Supple.  Slightly tender shotty posterior nodes are palpated bilaterally  Lungs:  Clear to auscultation.  Breath sounds are equal. Chest:  Distinct tenderness to palpation over  the mid-sternum.   Heart:  Regular rate and rhythm without murmurs, rubs, or gallops.  Abdomen:  Nontender without masses or hepatosplenomegaly.  Bowel sounds are present.  No CVA or flank tenderness.  Extremities:  No edema.  No calf tenderness Skin:  No rash present.   ED Course  Procedures  none      1. Acute upper respiratory infections of unspecified site; suspect early viral URI       MDM  There is no evidence of bacterial infection today.   Treat symptomatically for now  Prescription written for Benzonatate (Tessalon) to take at bedtime for night-time cough.  Take plain Mucinex (guaifenesin) twice daily for cough and congestion.  Increase fluid intake, rest. May use Afrin nasal spray (or generic oxymetazoline) twice daily for about 5 days.  Also recommend using saline nasal spray several times daily and saline nasal irrigation (AYR is a common brand).  Use Flonase  spray after Afrin and saline irrigation Stop all antihistamines for now, and other non-prescription cough/cold preparations. May take Ibuprofen 200mg , 4 tabs every 8 hours with food for chest/sternum discomfort. Begin Amoxicillin if not improving about 7 days or if persistent fever develops (Given a prescription to hold, with an expiration date). Follow-up with family doctor if not improving about10 days.        Lattie Haw, MD 05/24/12 [phone]

## 2012-06-19 ENCOUNTER — Other Ambulatory Visit: Payer: Self-pay | Admitting: Family Medicine

## 2013-01-04 ENCOUNTER — Other Ambulatory Visit: Payer: Self-pay

## 2013-04-13 ENCOUNTER — Other Ambulatory Visit: Payer: Self-pay | Admitting: Family Medicine

## 2013-04-13 ENCOUNTER — Ambulatory Visit: Payer: Self-pay | Admitting: Family Medicine

## 2013-04-13 ENCOUNTER — Encounter: Payer: Self-pay | Admitting: Sports Medicine

## 2013-04-13 ENCOUNTER — Ambulatory Visit (INDEPENDENT_AMBULATORY_CARE_PROVIDER_SITE_OTHER): Payer: BC Managed Care – PPO | Admitting: Sports Medicine

## 2013-04-13 VITALS — BP 165/100 | HR 90 | Ht 63.0 in | Wt 200.0 lb

## 2013-04-13 DIAGNOSIS — J0100 Acute maxillary sinusitis, unspecified: Secondary | ICD-10-CM

## 2013-04-13 MED ORDER — FLUTICASONE PROPIONATE 50 MCG/ACT NA SUSP: NASAL | Status: DC

## 2013-04-13 MED ORDER — AMOXICILLIN-POT CLAVULANATE 875-125 MG PO TABS: 1.0000 | ORAL_TABLET | Freq: Two times a day (BID) | ORAL | Status: DC

## 2013-04-13 NOTE — Progress Notes (Signed)
  Subjective:    CC: Sick  HPI: This pleasant 38 year old female with a history of eustachian tube dysfunction comes in with a several day history of sinus pain and pressure with radiation to the left ear. She has a purulent nasal discharge, no fevers, chills, no shortness of breath or lower respiratory symptoms. Symptoms are moderate, persistent. No GI symptoms, no rash.  Past medical history, Surgical history, Family history not pertinant except as noted below, Social history, Allergies, and medications have been entered into the medical record, reviewed, and no changes needed.   Review of Systems: No fevers, chills, night sweats, weight loss, chest pain, or shortness of breath.   Objective:    General: Well Developed, well nourished, and in no acute distress.  Neuro: Alert and oriented x3, extra-ocular muscles intact, sensation grossly intact.  HEENT: Normocephalic, atraumatic, pupils equal round reactive to light, neck supple, no masses, no lymphadenopathy, thyroid nonpalpable. Oropharynx, nasopharynx, external ear canals are unremarkable, tender to palpation over the maxillary sinuses bilaterally. Skin: Warm and dry, no rashes. Cardiac: Regular rate and rhythm, no murmurs rubs or gallops, no lower extremity edema.  Respiratory: Clear to auscultation bilaterally. Not using accessory muscles, speaking in full sentences.  Impression and Recommendations:

## 2013-04-13 NOTE — Assessment & Plan Note (Signed)
Augmentin, Flonase. Return as needed. 

## 2013-04-16 ENCOUNTER — Other Ambulatory Visit: Payer: Self-pay | Admitting: *Deleted

## 2013-04-16 ENCOUNTER — Other Ambulatory Visit: Payer: Self-pay

## 2013-04-16 MED ORDER — PANTOPRAZOLE SODIUM 40 MG PO TBEC: DELAYED_RELEASE_TABLET | ORAL | Status: DC

## 2013-04-16 NOTE — Telephone Encounter (Signed)
Patient request refill for Protonix  40mg  sent to [facility]. Patient hasnt seen PCP since 2013. Patient was advised that she needed to schedule follow up appt. Rhonda Cunningham,CMA

## 2013-11-25 ENCOUNTER — Telehealth: Payer: BC Managed Care – PPO | Admitting: Family

## 2013-11-25 DIAGNOSIS — J0101 Acute recurrent maxillary sinusitis: Secondary | ICD-10-CM

## 2013-11-25 MED ORDER — DOXYCYCLINE HYCLATE 100 MG PO TABS: 100.0000 mg | ORAL_TABLET | Freq: Two times a day (BID) | ORAL | Status: DC

## 2013-11-25 NOTE — Progress Notes (Signed)
We are sorry that you are not feeling well.  Here is how we plan to help!  Based on what you have shared with me it looks like you will need a face to face visit for complicated/severe symptoms which could represent a more serious problem.  If you are having a true medical emergency please call 911.  If you need an urgent face to face visit, [facility] has four urgent care centers for your convenience.  . [facility]  [phone] Get Driving Directions Find a Provider at this Location  [address] [place], [place] [address] . 8 am to 8 pm Monday-Friday . 9 am to 7 pm Saturday-Sunday  . [facility]  [phone] Get Driving Directions Find a Provider at this Location  [address], [address] [place], [place] [address] . 8 am to 8 pm Monday-Friday . 9 am to 6 pm Saturday . 11 am to 6 pm Sunday   . [facility] [place]  [phone] Get Driving Directions  [address] [place], [place] [address] . 8 am to 8 pm Monday-Friday . 9 am to 4 pm Saturday-Sunday   . [facility] (a walk in primary care provider)  [phone]  Get Driving Directions Find a Provider at this Location  [address] [place], [place] [address] . 8 am to 8:30 pm Monday-Thursday . 8 am to 6 pm Friday . 8 am to 4 pm Saturday-Sunday  Your e-visit answers were reviewed by a board certified advanced clinical practitioner to complete your personal care plan.  Depending on the condition, your plan could have included both over the counter or prescription medications.  You will get an e-mail in the next two days asking about your experience.  I hope that your e-visit has been valuable and will speed your recovery . Thank you for choosing an e-visit.   

## 2013-11-26 ENCOUNTER — Encounter: Payer: Self-pay | Admitting: Internal Medicine

## 2014-03-25 ENCOUNTER — Other Ambulatory Visit: Payer: Self-pay | Admitting: Family Medicine

## 2014-04-03 ENCOUNTER — Ambulatory Visit: Payer: Self-pay | Admitting: Family Medicine

## 2014-04-05 ENCOUNTER — Ambulatory Visit (INDEPENDENT_AMBULATORY_CARE_PROVIDER_SITE_OTHER): Payer: BLUE CROSS/BLUE SHIELD | Admitting: Family Medicine

## 2014-04-05 ENCOUNTER — Encounter: Payer: Self-pay | Admitting: Family Medicine

## 2014-04-05 VITALS — BP 179/102 | HR 123 | Wt 214.0 lb

## 2014-04-05 DIAGNOSIS — R030 Elevated blood-pressure reading, without diagnosis of hypertension: Secondary | ICD-10-CM

## 2014-04-05 DIAGNOSIS — K219 Gastro-esophageal reflux disease without esophagitis: Secondary | ICD-10-CM

## 2014-04-05 DIAGNOSIS — IMO0001 Reserved for inherently not codable concepts without codable children: Secondary | ICD-10-CM

## 2014-04-05 DIAGNOSIS — Z13220 Encounter for screening for lipoid disorders: Secondary | ICD-10-CM | POA: Diagnosis not present

## 2014-04-05 NOTE — Progress Notes (Signed)
   Subjective:    Patient ID: Shannon Clarke, female    DOB: 04/02/1975, 39 y.o.   MRN: [id number]  HPI Elevated BP readings - Elevated blood pressure - Denies chest pain, shortness of breath or headaches. She says it typically when she goes to doctors office like the internasal throat etc. her blood pressures very high usually in the 150s to 160s. She says at work she usually gets a Engineer, civil (consulting)nurse to check it and it always looks great. She works in a nursing home. I was  GERD - doing well on it. Sometimes takes it every other day.  Doesn't need refill on her flonase right now.    Review of Systems     Objective:   Physical Exam  Constitutional: She is oriented to person, place, and time. She appears well-developed and well-nourished.  HENT:  Head: Normocephalic and atraumatic.  Cardiovascular: Normal rate, regular rhythm and normal heart sounds.   Pulmonary/Chest: Effort normal and breath sounds normal.  Neurological: She is alert and oriented to person, place, and time.  Skin: Skin is warm and dry.  Psychiatric: She has a normal mood and affect. Her behavior is normal.          Assessment & Plan:  Suspect white coat hypertension based on her blood pressure readings at work. I did recommend checking a CMP and thyroid as well as a lipid panel. Lab slip provided. Though she may actually get this through her OB/GYN. Next  GERD-refill Protonix for one year. Next  Allergic rhinitis-she did not need a refill on the fluticasone at this time.

## 2014-04-10 ENCOUNTER — Other Ambulatory Visit: Payer: Self-pay

## 2014-04-10 MED ORDER — PANTOPRAZOLE SODIUM 40 MG PO TBEC: DELAYED_RELEASE_TABLET | ORAL | Status: DC

## 2014-07-23 ENCOUNTER — Encounter: Payer: Self-pay | Admitting: Family Medicine

## 2014-07-23 ENCOUNTER — Ambulatory Visit (INDEPENDENT_AMBULATORY_CARE_PROVIDER_SITE_OTHER): Payer: BLUE CROSS/BLUE SHIELD

## 2014-07-23 ENCOUNTER — Ambulatory Visit (INDEPENDENT_AMBULATORY_CARE_PROVIDER_SITE_OTHER): Payer: BLUE CROSS/BLUE SHIELD | Admitting: Family Medicine

## 2014-07-23 VITALS — BP 160/100 | HR 123 | Wt 201.0 lb

## 2014-07-23 DIAGNOSIS — R0781 Pleurodynia: Secondary | ICD-10-CM

## 2014-07-23 NOTE — Progress Notes (Signed)
   Subjective:    Patient ID: Shannon Clarke, female    DOB: 05/13/1975, 39 y.o.   MRN: [id number]  HPI She is having right side pain on and off for years. Says her skin is actually sensitive. If sheet wrinkle it is painful.  Has been worse the last 2 months.  Says happened recently after lifint a jug of milk out of the car. Today feels better. Has been using muscle rub. Worse if turns or if laughs.  Occ has back pain but not often. Say had a hard fall on her right side when in high school so about 20 years ago.   No chest pain or shortness of breath.   Review of Systems     Objective:   Physical Exam  Constitutional: She is oriented to person, place, and time. She appears well-developed and well-nourished.  HENT:  Head: Normocephalic and atraumatic.  Cardiovascular: Normal rate, regular rhythm and normal heart sounds.   Pulmonary/Chest: Effort normal and breath sounds normal.   Tender over the right side over the ribs themselves under the axilla and just above the right upper quadrant the abdomen.  Abdominal: Soft. Bowel sounds are normal. She exhibits no distension and no mass. There is no tenderness. There is no rebound and no guarding.  Neurological: She is alert and oriented to person, place, and time.  Skin: Skin is warm and dry.  Psychiatric: She has a normal mood and affect. Her behavior is normal.          Assessment & Plan:  Right side pain over the right lower ribs.   Very tender to touch on exam. Will get x-rays of the ribs in addition we'll also x-ray her thoracic spine. Explained it could be related to old injury. She said she did get x-rays after the initial injury 20 years ago and no fracture was seen. But certainly there could abdomen old injury. Also discussed that it could be coming from her thoracic spine. There could be a nerve that is getting pinched with certain positions and lifting etc. Thus causing the pain to radiate over to the right-sided ribs.

## 2016-07-01 ENCOUNTER — Encounter: Payer: Self-pay | Admitting: Family Medicine

## 2016-11-19 ENCOUNTER — Ambulatory Visit: Payer: Self-pay | Admitting: Family Medicine

## 2016-11-19 DIAGNOSIS — Z0189 Encounter for other specified special examinations: Secondary | ICD-10-CM

## 2017-02-04 ENCOUNTER — Encounter: Payer: Self-pay | Admitting: Family Medicine

## 2017-02-04 ENCOUNTER — Ambulatory Visit (INDEPENDENT_AMBULATORY_CARE_PROVIDER_SITE_OTHER): Payer: BLUE CROSS/BLUE SHIELD | Admitting: Family Medicine

## 2017-02-04 VITALS — BP 160/98 | HR 126 | Ht 63.0 in | Wt 227.0 lb

## 2017-02-04 DIAGNOSIS — F411 Generalized anxiety disorder: Secondary | ICD-10-CM | POA: Diagnosis not present

## 2017-02-04 DIAGNOSIS — R030 Elevated blood-pressure reading, without diagnosis of hypertension: Secondary | ICD-10-CM

## 2017-02-04 DIAGNOSIS — J0100 Acute maxillary sinusitis, unspecified: Secondary | ICD-10-CM

## 2017-02-04 DIAGNOSIS — K219 Gastro-esophageal reflux disease without esophagitis: Secondary | ICD-10-CM | POA: Insufficient documentation

## 2017-02-04 MED ORDER — CITALOPRAM HYDROBROMIDE 20 MG PO TABS: ORAL_TABLET | ORAL | 1 refills | Status: DC

## 2017-02-04 NOTE — Progress Notes (Signed)
Subjective:    Patient ID: Shannon Clarke, female    DOB: 08/27/1975, 41 y.o.   MRN: [id number]  HPI  41 year old female with no major medical problems comes in today for persistent cough this been going on for a couple of weeks.  She says it started right around Thanksgiving.  She was having cough and congestion.  She used the ED visit and was prescribed Augmentin.  At that point she is only been sick for about 2 days.  She completed the antibiotic about a week ago but has continued to have a productive cough mostly in the mornings and then it developed into a more dry cough by the end of the day.  No sore throat.  No fevers chills or sweats.  But she has had a lot of frontal sinus pressure as well as maxillary bilaterally.  Though today she actually feels much better.  Discuss her anxiety.  She has been feeling a little overwhelmed at work at times.  Most mornings she has a meeting that she has to attend and feels like she just wants to run an escape from the room.  She will start to get flushed in her face.  Her mom takes Celexa and wonders if this will be a good choice for her as well.  She denies any depressive symptoms.  Elevated blood pressure without diagnosis of hypertension-she says she checked her blood pressure at home this morning and it was 135/82.  She tends to have whitecoat hypertension.  Review of Systems  BP (!) 160/98   Pulse (!) 126   Ht 5\' 3"  (1.6 m)   Wt 227 lb (103 kg)   SpO2 99%   BMI 40.21 kg/m     No Known Allergies  Past Medical History:  Diagnosis Date  . Reflux     History reviewed. No pertinent surgical history.  Social History   Socioeconomic History  . Marital status: Married    Spouse name: Chrissie NoaWilliam  . Number of children: Not on file  . Years of education: Not on file  . Highest education level: Not on file  Social Needs  . Financial resource strain: Not on file  . Food insecurity - worry: Not on file  . Food insecurity - inability: Not on file   . Transportation needs - medical: Not on file  . Transportation needs - non-medical: Not on file  Occupational History  . Occupation: HR Clinical cytogeneticistmanager     Employer: [employer]    Comment: [employer]  Tobacco Use  . Smoking status: Never Smoker  . Smokeless tobacco: Never Used  Substance and Sexual Activity  . Alcohol use: No  . Drug use: No  . Sexual activity: Yes    Partners: Female  Other Topics Concern  . Not on file  Social History Narrative   No caffeine, no regular exercise.     Family History  Problem Relation Age of Onset  . Hypertension Mother   . Diabetes Maternal Grandmother   . Colon cancer Father [id number]  . Heart attack Maternal Grandfather   . Heart attack Maternal Grandmother     Outpatient Encounter Medications as of 02/04/2017  Medication Sig  . fluticasone (FLONASE) 50 MCG/ACT nasal spray One spray in each nostril twice a day, use left hand for right nostril, and right hand for left nostril.  . pantoprazole (PROTONIX) 40 MG tablet TAKE 1 TABLET (40 MG TOTAL) BY MOUTH DAILY.  . citalopram (CELEXA) 20 MG tablet  1/2 tab po QD x 2 weeks, then increase to 1 tab po QD   No facility-administered encounter medications on file as of 02/04/2017.           Objective:   Physical Exam  Constitutional: She is oriented to person, place, and time. She appears well-developed and well-nourished.  HENT:  Head: Normocephalic and atraumatic.  Right Ear: External ear normal.  Left Ear: External ear normal.  Nose: Nose normal.  Mouth/Throat: Oropharynx is clear and moist.  TMs and canals are clear.   Eyes: Conjunctivae and EOM are normal. Pupils are equal, round, and reactive to light.  Neck: Neck supple. No thyromegaly present.  Cardiovascular: Normal rate, regular rhythm and normal heart sounds.  Pulmonary/Chest: Effort normal and breath sounds normal. She has no wheezes.  Lymphadenopathy:    She has no cervical adenopathy.  Neurological: She is  alert and oriented to person, place, and time.  Skin: Skin is warm and dry.  Psychiatric: She has a normal mood and affect.        Assessment & Plan:  Acute sinusitis-I suspect this was actually viral which is why the antibiotics did not work.  She actually is feeling a lot better today so we will get this through the weekend to see if she continues to improve.  If not then after that we can give us a call back and I will put her on an antibiotic.  Will treat with azithromycin if needed.  Generalized anxiety disorder-it does not sound like she is had panic attacks at this point.  We will go ahead and start her on Celexa per her preference.  I will see her back in 4-6 weeks and we can adjust the medication at that time if needed.  Elevated blood pressure- recheck much better but still high.  She will continue to monitor at home.

## 2017-02-07 ENCOUNTER — Encounter: Payer: Self-pay | Admitting: Family Medicine

## 2017-02-08 MED ORDER — FLUOXETINE HCL 10 MG PO CAPS: 10.0000 mg | ORAL_CAPSULE | Freq: Every day | ORAL | 0 refills | Status: DC

## 2017-02-08 NOTE — Telephone Encounter (Signed)
Shannon Clarke states she felt jittery, nauseated and had a headache for the three days she took the medication. She did stop taking it.

## 2017-03-10 ENCOUNTER — Other Ambulatory Visit: Payer: Self-pay | Admitting: Family Medicine

## 2017-03-18 ENCOUNTER — Encounter: Payer: BLUE CROSS/BLUE SHIELD | Admitting: Family Medicine

## 2017-03-25 ENCOUNTER — Encounter: Payer: BLUE CROSS/BLUE SHIELD | Admitting: Family Medicine

## 2017-04-08 ENCOUNTER — Encounter: Payer: BLUE CROSS/BLUE SHIELD | Admitting: Family Medicine

## 2017-04-22 ENCOUNTER — Encounter: Payer: BLUE CROSS/BLUE SHIELD | Admitting: Family Medicine

## 2017-05-13 ENCOUNTER — Other Ambulatory Visit (HOSPITAL_COMMUNITY)
Admission: RE | Admit: 2017-05-13 | Discharge: 2017-05-13 | Disposition: A | Discharge status: Home / Self Care | Payer: BLUE CROSS/BLUE SHIELD | Source: Ambulatory Visit | Attending: Family Medicine | Admitting: Family Medicine

## 2017-05-13 ENCOUNTER — Ambulatory Visit (INDEPENDENT_AMBULATORY_CARE_PROVIDER_SITE_OTHER): Payer: BLUE CROSS/BLUE SHIELD | Admitting: Family Medicine

## 2017-05-13 ENCOUNTER — Encounter: Payer: Self-pay | Admitting: Family Medicine

## 2017-05-13 VITALS — BP 140/78 | HR 124 | Ht 63.0 in | Wt 220.0 lb

## 2017-05-13 DIAGNOSIS — R030 Elevated blood-pressure reading, without diagnosis of hypertension: Secondary | ICD-10-CM

## 2017-05-13 DIAGNOSIS — Z0000 Encounter for general adult medical examination without abnormal findings: Secondary | ICD-10-CM | POA: Diagnosis not present

## 2017-05-13 DIAGNOSIS — Z124 Encounter for screening for malignant neoplasm of cervix: Secondary | ICD-10-CM | POA: Diagnosis not present

## 2017-05-13 NOTE — Progress Notes (Signed)
Subjective:     Shannon Clarke is a 42 y.o. female and is here for a comprehensive physical exam. The patient reports no problems. No regular exercise.   Social History   Socioeconomic History  . Marital status: Married    Spouse name: Chrissie Noa  . Number of children: Not on file  . Years of education: Not on file  . Highest education level: Not on file  Social Needs  . Financial resource strain: Not on file  . Food insecurity - worry: Not on file  . Food insecurity - inability: Not on file  . Transportation needs - medical: Not on file  . Transportation needs - non-medical: Not on file  Occupational History  . Occupation: HR Clinical cytogeneticist: [employer]    Comment: [employer]  Tobacco Use  . Smoking status: Never Smoker  . Smokeless tobacco: Never Used  Substance and Sexual Activity  . Alcohol use: No  . Drug use: No  . Sexual activity: Yes    Partners: Female  Other Topics Concern  . Not on file  Social History Narrative   No caffeine, no regular exercise.    Health Maintenance  Topic Date Due  . HIV Screening  01/07/1993  . PAP SMEAR  01/08/1999  . TETANUS/TDAP  03/01/2017  . INFLUENZA VACCINE  Completed    The following portions of the patient's history were reviewed and updated as appropriate: allergies, current medications, past family history, past medical history, past social history, past surgical history and problem list.  Review of Systems A comprehensive review of systems was negative.   Objective:    BP 140/78   Pulse (!) 124   Ht 5\' 3"  (1.6 m)   Wt 220 lb (99.8 kg)   SpO2 98%   BMI 38.97 kg/m  General appearance: alert Head: Normocephalic, without obvious abnormality, atraumatic Eyes: conj clear, EOMI, PEERLA Ears: normal TM's and external ear canals both ears Nose: Nares normal. Septum midline. Mucosa normal. No drainage or sinus tenderness. Throat: lips, mucosa, and tongue normal; teeth and gums normal Neck: no  adenopathy, no carotid bruit, no JVD, supple, symmetrical, trachea midline and thyroid not enlarged, symmetric, no tenderness/mass/nodules Back: symmetric, no curvature. ROM normal. No CVA tenderness. Lungs: clear to auscultation bilaterally Breasts: normal appearance, no masses or tenderness Heart: regular rate and rhythm, S1, S2 normal, no murmur, click, rub or gallop Abdomen: soft, non-tender; bowel sounds normal; no masses,  no organomegaly Pelvic: cervix normal in appearance, external genitalia normal, no adnexal masses or tenderness, no cervical motion tenderness, rectovaginal septum normal, uterus normal size, shape, and consistency and vagina normal without discharge Extremities: extremities normal, atraumatic, no cyanosis or edema Pulses: 2+ and symmetric Skin: Skin color, texture, turgor normal. No rashes or lesions Lymph nodes: Cervical, supraclavicular, and axillary nodes normal. Neurologic: Alert and oriented X 3, normal strength and tone. Normal symmetric reflexes. Normal coordination and gait    Assessment:    Healthy female exam.      Plan:     See After Visit Summary for Counseling Recommendations   complete physical examination Keep up a regular exercise program and make sure you are eating a healthy diet Try to eat 4 servings of dairy a day, or if you are lactose intolerant take a calcium with vitamin D daily.   Elevated BP-borderline today.  We will keep an eye on this.  She thinks her tetanus shot is up-to-date so she will try to give  us a call and let us know when that was performed. Pap smear performed.  Will call with results once available.  We will add co-testing. Due for screening labs.  She will try to go sometime next week. She is not actively exercising so did encourage her to start an exercise program.

## 2017-05-13 NOTE — Patient Instructions (Signed)
Preventive Care 18-39 Years, Female Preventive care refers to lifestyle choices and visits with your health care provider that can promote health and wellness. What does preventive care include?  A yearly physical exam. This is also called an annual well check.  Dental exams once or twice a year.  Routine eye exams. Ask your health care provider how often you should have your eyes checked.  Personal lifestyle choices, including: ? Daily care of your teeth and gums. ? Regular physical activity. ? Eating a healthy diet. ? Avoiding tobacco and drug use. ? Limiting alcohol use. ? Practicing safe sex. ? Taking vitamin and mineral supplements as recommended by your health care provider. What happens during an annual well check? The services and screenings done by your health care provider during your annual well check will depend on your age, overall health, lifestyle risk factors, and family history of disease. Counseling Your health care provider may ask you questions about your:  Alcohol use.  Tobacco use.  Drug use.  Emotional well-being.  Home and relationship well-being.  Sexual activity.  Eating habits.  Work and work Statistician.  Method of birth control.  Menstrual cycle.  Pregnancy history.  Screening You may have the following tests or measurements:  Height, weight, and BMI.  Diabetes screening. This is done by checking your blood sugar (glucose) after you have not eaten for a while (fasting).  Blood pressure.  Lipid and cholesterol levels. These may be checked every 5 years starting at age 38.  Skin check.  Hepatitis C blood test.  Hepatitis B blood test.  Sexually transmitted disease (STD) testing.  BRCA-related cancer screening. This may be done if you have a family history of breast, ovarian, tubal, or peritoneal cancers.  Pelvic exam and Pap test. This may be done every 3 years starting at age 38. Starting at age 30, this may be done  every 5 years if you have a Pap test in combination with an HPV test.  Discuss your test results, treatment options, and if necessary, the need for more tests with your health care provider. Vaccines Your health care provider may recommend certain vaccines, such as:  Influenza vaccine. This is recommended every year.  Tetanus, diphtheria, and acellular pertussis (Tdap, Td) vaccine. You may need a Td booster every 10 years.  Varicella vaccine. You may need this if you have not been vaccinated.  HPV vaccine. If you are 39 or younger, you may need three doses over 6 months.  Measles, mumps, and rubella (MMR) vaccine. You may need at least one dose of MMR. You may also need a second dose.  Pneumococcal 13-valent conjugate (PCV13) vaccine. You may need this if you have certain conditions and were not previously vaccinated.  Pneumococcal polysaccharide (PPSV23) vaccine. You may need one or two doses if you smoke cigarettes or if you have certain conditions.  Meningococcal vaccine. One dose is recommended if you are age 68-21 years and a first-year college student living in a residence hall, or if you have one of several medical conditions. You may also need additional booster doses.  Hepatitis A vaccine. You may need this if you have certain conditions or if you travel or work in places where you may be exposed to hepatitis A.  Hepatitis B vaccine. You may need this if you have certain conditions or if you travel or work in places where you may be exposed to hepatitis B.  Haemophilus influenzae type b (Hib) vaccine. You may need this  if you have certain risk factors.  Talk to your health care provider about which screenings and vaccines you need and how often you need them. This information is not intended to replace advice given to you by your health care provider. Make sure you discuss any questions you have with your health care provider. Document Released: 04/13/2001 Document Revised:  11/05/2015 Document Reviewed: 12/17/2014 Elsevier Interactive Patient Education  2018 Elsevier Inc.  

## 2017-05-16 ENCOUNTER — Encounter: Payer: Self-pay | Admitting: Family Medicine

## 2017-05-16 ENCOUNTER — Other Ambulatory Visit: Payer: Self-pay | Admitting: Family Medicine

## 2017-05-17 LAB — CYTOLOGY - PAP
Diagnosis: NEGATIVE
HPV: NOT DETECTED

## 2017-05-17 MED ORDER — PROMETHAZINE HCL 25 MG PO TABS: 25.0000 mg | ORAL_TABLET | Freq: Three times a day (TID) | ORAL | 0 refills | Status: DC | PRN

## 2017-05-17 MED ORDER — PANTOPRAZOLE SODIUM 40 MG PO TBEC: DELAYED_RELEASE_TABLET | ORAL | 3 refills | Status: DC

## 2017-05-17 NOTE — Progress Notes (Signed)
Call patient: Your Pap smear is normal. Repeat in 5 years.

## 2017-08-11 ENCOUNTER — Ambulatory Visit: Payer: Self-pay | Admitting: Family Medicine

## 2017-09-12 ENCOUNTER — Other Ambulatory Visit: Payer: Self-pay | Admitting: Family Medicine

## 2017-11-27 ENCOUNTER — Other Ambulatory Visit: Payer: Self-pay | Admitting: Family Medicine

## 2018-01-24 ENCOUNTER — Encounter: Payer: Self-pay | Admitting: Family Medicine

## 2018-01-25 MED ORDER — ONDANSETRON HCL 4 MG PO TABS: 4.0000 mg | ORAL_TABLET | Freq: Three times a day (TID) | ORAL | 0 refills | Status: DC | PRN

## 2018-01-25 NOTE — Telephone Encounter (Signed)
rx sent

## 2018-01-31 ENCOUNTER — Ambulatory Visit: Payer: Self-pay | Admitting: Family Medicine

## 2018-01-31 NOTE — Progress Notes (Deleted)
   Subjective:    Patient ID: Shannon Clarke, female    DOB: 06/13/1975, 42 y.o.   MRN: [id number]  HPI 42 yo female comes in today for abdominal pain.  She does have a history of reflux.  She also takes a daily PPI.   Review of Systems     There were no vitals taken for this visit.    No Known Allergies  Past Medical History:  Diagnosis Date  . Reflux     No past surgical history on file.  Social History   Socioeconomic History  . Marital status: Married    Spouse name: Chrissie NoaWilliam  . Number of children: Not on file  . Years of education: Not on file  . Highest education level: Not on file  Occupational History  . Occupation: HR Clinical cytogeneticistmanager     Employer: [employer]    Comment: [employer]  Social Needs  . Financial resource strain: Not on file  . Food insecurity:    Worry: Not on file    Inability: Not on file  . Transportation needs:    Medical: Not on file    Non-medical: Not on file  Tobacco Use  . Smoking status: Never Smoker  . Smokeless tobacco: Never Used  Substance and Sexual Activity  . Alcohol use: No  . Drug use: No  . Sexual activity: Yes    Partners: Female  Lifestyle  . Physical activity:    Days per week: Not on file    Minutes per session: Not on file  . Stress: Not on file  Relationships  . Social connections:    Talks on phone: Not on file    Gets together: Not on file    Attends religious service: Not on file    Active member of club or organization: Not on file    Attends meetings of clubs or organizations: Not on file    Relationship status: Not on file  . Intimate partner violence:    Fear of current or ex partner: Not on file    Emotionally abused: Not on file    Physically abused: Not on file    Forced sexual activity: Not on file  Other Topics Concern  . Not on file  Social History Narrative   No caffeine, no regular exercise.     Family History  Problem Relation Age of Onset  . Hypertension Mother    . Diabetes Maternal Grandmother   . Colon cancer Father [phone]  . Heart attack Maternal Grandfather   . Heart attack Maternal Grandmother     Outpatient Encounter Medications as of 01/31/2018  Medication Sig  . FLUoxetine (PROZAC) 10 MG capsule TAKE 2 CAPSULES (20 MG TOTAL) BY MOUTH DAILY.  . fluticasone (FLONASE) 50 MCG/ACT nasal spray One spray in each nostril twice a day, use left hand for right nostril, and right hand for left nostril.  Marland Kitchen. ondansetron (ZOFRAN) 4 MG tablet Take 1 tablet (4 mg total) by mouth every 8 (eight) hours as needed for nausea or vomiting.  . pantoprazole (PROTONIX) 40 MG tablet TAKE 1 TABLET (40 MG TOTAL) BY MOUTH DAILY.   No facility-administered encounter medications on file as of 01/31/2018.       Objective:   Physical Exam        Assessment & Plan:

## 2018-02-06 ENCOUNTER — Ambulatory Visit (INDEPENDENT_AMBULATORY_CARE_PROVIDER_SITE_OTHER): Payer: BLUE CROSS/BLUE SHIELD | Admitting: Family Medicine

## 2018-02-06 ENCOUNTER — Encounter: Payer: Self-pay | Admitting: Family Medicine

## 2018-02-06 VITALS — BP 140/78 | HR 133 | Ht 61.42 in | Wt 207.0 lb

## 2018-02-06 DIAGNOSIS — R1011 Right upper quadrant pain: Secondary | ICD-10-CM

## 2018-02-06 NOTE — Progress Notes (Signed)
Subjective:    Patient ID: Bufford Lope, female    DOB: Aug 23, 1975, 42 y.o.   MRN: [id number]  HPI Agree with HPI taken by student. Reviewed history with the patient.  She has had upper respiratory symptoms including cough and then a stomach bug with diarrhea over the last 2 weeks.  Also over that time she is been experiencing some right upper quadrant pain really below the rib area just below the breast.  No nausea or vomiting.  Blood in the stool..  Last episdoe of diarrhea was 2 days ago and she has been feeling better in regards to that..  No fever, chills, sweats.  Silica respiratory symptoms have improved.  He says the right upper quadrant discomfort is really more of an irritation versus actual pain.  But she just feels it.  She had similar discomfort back in 2013 when she had been doing some heavy lifting and had moved.   Review of Systems  BP 140/78   Pulse (!) 133   Ht 5' 1.42" (1.56 m)   Wt 207 lb (93.9 kg)   SpO2 100%   BMI 38.58 kg/m     No Known Allergies  Past Medical History:  Diagnosis Date  . Reflux     No past surgical history on file.  Social History   Socioeconomic History  . Marital status: Married    Spouse name: Chrissie Noa  . Number of children: Not on file  . Years of education: Not on file  . Highest education level: Not on file  Occupational History  . Occupation: HR Clinical cytogeneticist: [employer]    Comment: [employer]  Social Needs  . Financial resource strain: Not on file  . Food insecurity:    Worry: Not on file    Inability: Not on file  . Transportation needs:    Medical: Not on file    Non-medical: Not on file  Tobacco Use  . Smoking status: Never Smoker  . Smokeless tobacco: Never Used  Substance and Sexual Activity  . Alcohol use: No  . Drug use: No  . Sexual activity: Yes    Partners: Female  Lifestyle  . Physical activity:    Days per week: Not on file    Minutes per session: Not on file  .  Stress: Not on file  Relationships  . Social connections:    Talks on phone: Not on file    Gets together: Not on file    Attends religious service: Not on file    Active member of club or organization: Not on file    Attends meetings of clubs or organizations: Not on file    Relationship status: Not on file  . Intimate partner violence:    Fear of current or ex partner: Not on file    Emotionally abused: Not on file    Physically abused: Not on file    Forced sexual activity: Not on file  Other Topics Concern  . Not on file  Social History Narrative   No caffeine, no regular exercise.     Family History  Problem Relation Age of Onset  . Hypertension Mother   . Diabetes Maternal Grandmother   . Colon cancer Father 93  . Heart attack Maternal Grandfather   . Heart attack Maternal Grandmother     Outpatient Encounter Medications as of 02/06/2018  Medication Sig  . FLUoxetine (PROZAC) 10 MG capsule TAKE 2 CAPSULES (20 MG TOTAL)  BY MOUTH DAILY.  . fluticasone (FLONASE) 50 MCG/ACT nasal spray One spray in each nostril twice a day, use left hand for right nostril, and right hand for left nostril.  Marland Kitchen. ondansetron (ZOFRAN) 4 MG tablet Take 1 tablet (4 mg total) by mouth every 8 (eight) hours as needed for nausea or vomiting.  . pantoprazole (PROTONIX) 40 MG tablet TAKE 1 TABLET (40 MG TOTAL) BY MOUTH DAILY.   No facility-administered encounter medications on file as of 02/06/2018.          Objective:   Physical Exam  Constitutional: She is oriented to person, place, and time. She appears well-developed and well-nourished.  HENT:  Head: Normocephalic and atraumatic.  Cardiovascular: Normal rate, regular rhythm and normal heart sounds.  Pulmonary/Chest: Effort normal and breath sounds normal.  Abdominal: Soft. Bowel sounds are normal. She exhibits no distension. There is tenderness. There is no rebound and no guarding.  Unable to wait liver edge easily as patient was becoming  tickled.  She would tighten abdomen.  Scratch test indicated possible distention of liver edge.   Musculoskeletal:  She is tender over the right lower ribs as well.   Neurological: She is alert and oriented to person, place, and time.  Skin: Skin is warm and dry.  Psychiatric: She has a normal mood and affect. Her behavior is normal.       Assessment & Plan:  Right upper quadrant pain-we will work-up further and evaluate for elevated liver enzymes/hepatitis. Didn't order US at this time.  She is feeling some better.  Call if pain worsens or persists.  Can use heating pad since that does seem to help.

## 2018-02-06 NOTE — Progress Notes (Signed)
Established Patient Office Visit  Subjective:  Patient ID: Shannon Clarke, female    DOB: 08-Sep-1975  Age: 42 y.o. MRN: [id number]  CC:  Chief Complaint  Patient presents with  . Chest Pain    R sided x 1 wk pt reports it as being "irritating"    HPI Shannon Clarke presents for stomach pains. She has hx of GERD. She reports pain in her RUQ that is worse when she lays down. She describes the pain as "irritation" rather than sharp pain. She states that it feels like a cramp that is constantly there. She states that the pain started about a week ago, about the same time as having a concurrent URI/gastroenteritis. Her last episode of diarrhea was 1-2 days ago. She admits to some fatigue.   She has taken ibuprofen which she reports is helpful. Denies fever, pleuritic pain, SOB, chest pain, N/V, dizziness or syncope. Eating does relief or irritate the pain.  Past Medical History:  Diagnosis Date  . Reflux     No past surgical history on file.  Family History  Problem Relation Age of Onset  . Hypertension Mother   . Diabetes Maternal Grandmother   . Colon cancer Father 18  . Heart attack Maternal Grandfather   . Heart attack Maternal Grandmother     Social History   Socioeconomic History  . Marital status: Married    Spouse name: Chrissie Noa  . Number of children: Not on file  . Years of education: Not on file  . Highest education level: Not on file  Occupational History  . Occupation: HR Clinical cytogeneticist: Advice worker    Comment: [employer]  Social Needs  . Financial resource strain: Not on file  . Food insecurity:    Worry: Not on file    Inability: Not on file  . Transportation needs:    Medical: Not on file    Non-medical: Not on file  Tobacco Use  . Smoking status: Never Smoker  . Smokeless tobacco: Never Used  Substance and Sexual Activity  . Alcohol use: No  . Drug use: No  . Sexual activity: Yes    Partners: Female  Lifestyle  .  Physical activity:    Days per week: Not on file    Minutes per session: Not on file  . Stress: Not on file  Relationships  . Social connections:    Talks on phone: Not on file    Gets together: Not on file    Attends religious service: Not on file    Active member of club or organization: Not on file    Attends meetings of clubs or organizations: Not on file    Relationship status: Not on file  . Intimate partner violence:    Fear of current or ex partner: Not on file    Emotionally abused: Not on file    Physically abused: Not on file    Forced sexual activity: Not on file  Other Topics Concern  . Not on file  Social History Narrative   No caffeine, no regular exercise.     Outpatient Medications Prior to Visit  Medication Sig Dispense Refill  . FLUoxetine (PROZAC) 10 MG capsule TAKE 2 CAPSULES (20 MG TOTAL) BY MOUTH DAILY. 180 capsule 0  . fluticasone (FLONASE) 50 MCG/ACT nasal spray One spray in each nostril twice a day, use left hand for right nostril, and right hand for left nostril. 48 g 3  .  ondansetron (ZOFRAN) 4 MG tablet Take 1 tablet (4 mg total) by mouth every 8 (eight) hours as needed for nausea or vomiting. 10 tablet 0  . pantoprazole (PROTONIX) 40 MG tablet TAKE 1 TABLET (40 MG TOTAL) BY MOUTH DAILY. 90 tablet 3   No facility-administered medications prior to visit.     No Known Allergies  ROS Review of Systems  Constitutional: Positive for fatigue. Negative for chills and fever.  Respiratory: Negative for cough, shortness of breath and wheezing.   Cardiovascular: Negative for chest pain.  Gastrointestinal: Negative for nausea and vomiting.       Discomfort in RUQ  Genitourinary: Negative.   Neurological: Negative for dizziness and syncope.      Objective:    Physical Exam  Constitutional: She is oriented to person, place, and time. She appears well-developed and well-nourished. No distress.  Cardiovascular: Normal rate and regular rhythm. Exam  reveals no gallop and no friction rub.  No murmur heard. Pulmonary/Chest: Effort normal and breath sounds normal. No respiratory distress. She has no wheezes.  Abdominal: There is tenderness.  Tenderness along RUQ spanning the length of the liver and liver edge  Musculoskeletal: She exhibits tenderness.  Tenderness across ribs in RUQ  Neurological: She is alert and oriented to person, place, and time.  Psychiatric: She has a normal mood and affect. Her behavior is normal. Judgment and thought content normal.    BP 140/78   Pulse (!) 133   Ht 5' 1.42" (1.56 m)   Wt 93.9 kg   SpO2 100%   BMI 38.58 kg/m  Wt Readings from Last 3 Encounters:  02/06/18 93.9 kg  05/13/17 99.8 kg  02/04/17 103 kg     There are no preventive care reminders to display for this patient.  There are no preventive care reminders to display for this patient.  No results found for: TSH Lab Results  Component Value Date   WBC 7.9 10/14/2011   HGB 14.9 10/14/2011   HCT 43.4 10/14/2011   MCV 83.0 10/14/2011   PLT 375 10/14/2011   Lab Results  Component Value Date   NA 141 10/14/2011   K 3.9 10/14/2011   CO2 24 10/14/2011   GLUCOSE 109 (H) 10/14/2011   BUN 8 10/14/2011   CREATININE 0.78 10/14/2011   BILITOT 0.4 10/14/2011   ALKPHOS 75 10/14/2011   AST 15 10/14/2011   ALT 13 10/14/2011   PROT 7.2 10/14/2011   ALBUMIN 4.8 10/14/2011   CALCIUM 9.9 10/14/2011   No results found for: CHOL No results found for: HDL No results found for: LDLCALC No results found for: TRIG No results found for: CHOLHDL No results found for: ZHYQ6VHGBA1C    Assessment & Plan:   Problem List Items Addressed This Visit    None    Visit Diagnoses    RUQ pain    -  Primary   Relevant Orders   COMPLETE METABOLIC PANEL WITH GFR   CBC with Differential/Platelet   Hepatitis panel, acute      No orders of the defined types were placed in this encounter.  Order CMP to r/o acute hepatitis. This may also be  costochondritis due to recent illness w/ coughing and vomiting. She did have similar symptoms in the past after moving that resolved spontaneously. If CMP shows normal LFTs consider supportive treatment for muscle irritation.   Follow-up: Return if symptoms worsen or fail to improve.    Tedra CoupeSarah Raguel Kosloski, [place] NeighborStudent-PA

## 2018-02-08 ENCOUNTER — Encounter: Payer: Self-pay | Admitting: Family Medicine

## 2018-02-08 ENCOUNTER — Other Ambulatory Visit: Payer: Self-pay | Admitting: *Deleted

## 2018-02-08 DIAGNOSIS — E876 Hypokalemia: Secondary | ICD-10-CM

## 2018-02-08 DIAGNOSIS — D649 Anemia, unspecified: Secondary | ICD-10-CM

## 2018-02-08 LAB — COMPLETE METABOLIC PANEL WITH GFR
AG Ratio: 1.6 (calc) (ref 1.0–2.5)
ALT: 20 U/L (ref 6–29)
AST: 17 U/L (ref 10–30)
Albumin: 4.5 g/dL (ref 3.6–5.1)
Alkaline phosphatase (APISO): 78 U/L (ref 33–115)
BUN/Creatinine Ratio: 7 (calc) (ref 6–22)
BUN: 6 mg/dL — ABNORMAL LOW (ref 7–25)
CO2: 27 mmol/L (ref 20–32)
Calcium: 10.2 mg/dL (ref 8.6–10.2)
Chloride: 105 mmol/L (ref 98–110)
Creat: 0.87 mg/dL (ref 0.50–1.10)
GFR, Est African American: 97 mL/min/{1.73_m2} (ref >=60)
GFR, Est Non African American: 83 mL/min/{1.73_m2} (ref >=60)
Globulin: 2.8 g/dL (calc) (ref 1.9–3.7)
Glucose, Bld: 121 mg/dL — ABNORMAL HIGH (ref 65–99)
Potassium: 3.4 mmol/L — ABNORMAL LOW (ref 3.5–5.3)
Sodium: 141 mmol/L (ref 135–146)
Total Bilirubin: 0.6 mg/dL (ref 0.2–1.2)
Total Protein: 7.3 g/dL (ref 6.1–8.1)

## 2018-02-08 LAB — CBC WITH DIFFERENTIAL/PLATELET
Basophils Absolute: 39 cells/uL (ref 0–200)
Basophils Relative: 0.5 %
EOS ABS: 39 {cells}/uL (ref 15–500)
Eosinophils Relative: 0.5 %
HCT: 47.6 % — ABNORMAL HIGH (ref 35.0–45.0)
Hemoglobin: 16.5 g/dL — ABNORMAL HIGH (ref 11.7–15.5)
Lymphs Abs: 1482 cells/uL (ref 850–3900)
MCH: 29.7 pg (ref 27.0–33.0)
MCHC: 34.7 g/dL (ref 32.0–36.0)
MCV: 85.6 fL (ref 80.0–100.0)
MPV: 11.7 fL (ref 7.5–12.5)
Monocytes Relative: 6 %
Neutro Abs: 5772 cells/uL (ref 1500–7800)
Neutrophils Relative %: 74 %
PLATELETS: 347 10*3/uL (ref 140–400)
RBC: 5.56 10*6/uL — ABNORMAL HIGH (ref 3.80–5.10)
RDW: 12.6 % (ref 11.0–15.0)
Total Lymphocyte: 19 %
WBC mixed population: 468 cells/uL (ref 200–950)
WBC: 7.8 10*3/uL (ref 3.8–10.8)

## 2018-02-08 LAB — HEPATITIS PANEL, ACUTE
Hep A IgM: NONREACTIVE
Hep B C IgM: NONREACTIVE
Hepatitis B Surface Ag: NONREACTIVE
Hepatitis C Ab: NONREACTIVE
SIGNAL TO CUT-OFF: 0.01 (ref <1.00)

## 2018-02-09 ENCOUNTER — Encounter: Payer: Self-pay | Admitting: Family Medicine

## 2018-03-02 ENCOUNTER — Encounter: Payer: Self-pay | Admitting: Family Medicine

## 2018-03-09 ENCOUNTER — Telehealth: Payer: Self-pay

## 2018-03-09 NOTE — Telephone Encounter (Signed)
Mailed letter to home address.

## 2018-04-20 ENCOUNTER — Encounter: Payer: BLUE CROSS/BLUE SHIELD | Admitting: Family Medicine

## 2018-05-05 ENCOUNTER — Ambulatory Visit (INDEPENDENT_AMBULATORY_CARE_PROVIDER_SITE_OTHER): Payer: Medicaid Other | Admitting: Family Medicine

## 2018-05-05 ENCOUNTER — Encounter: Payer: Self-pay | Admitting: Family Medicine

## 2018-05-05 VITALS — HR 105 | Ht 61.0 in | Wt 198.0 lb

## 2018-05-05 DIAGNOSIS — R519 Headache, unspecified: Secondary | ICD-10-CM

## 2018-05-05 DIAGNOSIS — R51 Headache: Secondary | ICD-10-CM

## 2018-05-05 MED ORDER — PREDNISONE 10 MG PO TABS: ORAL_TABLET | ORAL | 0 refills | Status: DC

## 2018-05-05 MED ORDER — AMOXICILLIN-POT CLAVULANATE 875-125 MG PO TABS: 1.0000 | ORAL_TABLET | Freq: Two times a day (BID) | ORAL | 0 refills | Status: DC

## 2018-05-05 NOTE — Progress Notes (Signed)
Acute Office Visit  Subjective:    Patient ID: Shannon Clarke, female    DOB: Dec 12, 1975, 43 y.o.   MRN: [id number]  Chief Complaint  Patient presents with  . Headache    HPI Patient is in today for persistant HA for one week that starts in the corner of her left eye to her temple.  + pain in her teeth.  Having some sinus pressure.  She did do a teledoc visit and was given an antibiotic.  Her left ear felt like it had fluid in it last night. She feels off balance. No fever, sweats or chills.  Husband says she gets HAs a lot.  She says that his headaches are more tension type and usually not quite disappear.  She rates her current headache is a 9 out of 10 she denies any swallowing problems.  No recent infections.  Though she did feel like she had a cold maybe a week ago which is why she thought it was sinus pressure which is why she had initially called the tele-doc.  She denies any actual room spinning or dizziness but says she just feels a little off balance.  She denies any vision changes or weakness in the extremities numbness tingling or paresthesias.  She did take about 4 ibuprofen around 1:00 this afternoon and says that has helped reduce her pain but is not completely gone.  She denies any fever or speech changes.    Past Medical History:  Diagnosis Date  . Reflux     No past surgical history on file.  Family History  Problem Relation Age of Onset  . Hypertension Mother   . Diabetes Maternal Grandmother   . Colon cancer Father 77  . Heart attack Maternal Grandfather   . Heart attack Maternal Grandmother     Social History   Socioeconomic History  . Marital status: Married    Spouse name: Chrissie Noa  . Number of children: Not on file  . Years of education: Not on file  . Highest education level: Not on file  Occupational History  . Occupation: HR Clinical cytogeneticist: Advice worker    Comment: [employer]  Social Needs  . Financial resource  strain: Not on file  . Food insecurity:    Worry: Not on file    Inability: Not on file  . Transportation needs:    Medical: Not on file    Non-medical: Not on file  Tobacco Use  . Smoking status: Never Smoker  . Smokeless tobacco: Never Used  Substance and Sexual Activity  . Alcohol use: No  . Drug use: No  . Sexual activity: Yes    Partners: Female  Lifestyle  . Physical activity:    Days per week: Not on file    Minutes per session: Not on file  . Stress: Not on file  Relationships  . Social connections:    Talks on phone: Not on file    Gets together: Not on file    Attends religious service: Not on file    Active member of club or organization: Not on file    Attends meetings of clubs or organizations: Not on file    Relationship status: Not on file  . Intimate partner violence:    Fear of current or ex partner: Not on file    Emotionally abused: Not on file    Physically abused: Not on file    Forced sexual activity: Not on file  Other Topics Concern  . Not on file  Social History Narrative   No caffeine, no regular exercise.     Outpatient Medications Prior to Visit  Medication Sig Dispense Refill  . FLUoxetine (PROZAC) 10 MG capsule TAKE 2 CAPSULES (20 MG TOTAL) BY MOUTH DAILY. 180 capsule 0  . fluticasone (FLONASE) 50 MCG/ACT nasal spray One spray in each nostril twice a day, use left hand for right nostril, and right hand for left nostril. 48 g 3  . pantoprazole (PROTONIX) 40 MG tablet TAKE 1 TABLET (40 MG TOTAL) BY MOUTH DAILY. 90 tablet 3  . ondansetron (ZOFRAN) 4 MG tablet Take 1 tablet (4 mg total) by mouth every 8 (eight) hours as needed for nausea or vomiting. 10 tablet 0   No facility-administered medications prior to visit.     Allergies  Allergen Reactions  . Levaquin [Levofloxacin] Diarrhea and Nausea Only    ROS     Objective:    Physical Exam  Constitutional: She is oriented to person, place, and time. She appears well-developed and  well-nourished.  HENT:  Head: Normocephalic and atraumatic.  Right Ear: External ear normal.  Left Ear: External ear normal.  Nose: Nose normal.  Mouth/Throat: Oropharynx is clear and moist.  TMs and canals are clear.  Facial cheek appears to be swollen on exam but no increase in erythema or induration. No swelling in the nares.   Eyes: Pupils are equal, round, and reactive to light. Conjunctivae and EOM are normal.  Neck: Neck supple. No thyromegaly present.  Cardiovascular: Normal rate, regular rhythm and normal heart sounds.  Pulmonary/Chest: Effort normal and breath sounds normal. She has no wheezes.  Lymphadenopathy:    She has no cervical adenopathy.  Neurological: She is alert and oriented to person, place, and time. She has normal reflexes. She displays normal reflexes. No cranial nerve deficit. She exhibits normal muscle tone.  Skin: Skin is warm and dry.  Psychiatric: She has a normal mood and affect. Her behavior is normal. Thought content normal.    Pulse (!) 105   Ht 5\' 1"  (1.549 m)   Wt 198 lb (89.8 kg)   LMP 05/01/2018   SpO2 99%   BMI 37.41 kg/m  Wt Readings from Last 3 Encounters:  05/05/18 198 lb (89.8 kg)  02/06/18 207 lb (93.9 kg)  05/13/17 220 lb (99.8 kg)    There are no preventive care reminders to display for this patient.  There are no preventive care reminders to display for this patient.   No results found for: TSH Lab Results  Component Value Date   WBC 7.8 02/07/2018   HGB 16.5 (H) 02/07/2018   HCT 47.6 (H) 02/07/2018   MCV 85.6 02/07/2018   PLT 347 02/07/2018   Lab Results  Component Value Date   NA 141 02/07/2018   K 3.4 (L) 02/07/2018   CO2 27 02/07/2018   GLUCOSE 121 (H) 02/07/2018   BUN 6 (L) 02/07/2018   CREATININE 0.87 02/07/2018   BILITOT 0.6 02/07/2018   ALKPHOS 75 10/14/2011   AST 17 02/07/2018   ALT 20 02/07/2018   PROT 7.3 02/07/2018   ALBUMIN 4.8 10/14/2011   CALCIUM 10.2 02/07/2018   No results found for:  CHOL No results found for: HDL No results found for: LDLCALC No results found for: TRIG No results found for: CHOLHDL No results found for: [id number]     Assessment & Plan:   Problem List Items Addressed This Visit    None  Visit Diagnoses    Acute intractable headache, unspecified headache type    -  Primary   Relevant Medications   amoxicillin-clavulanate (AUGMENTIN) 875-125 MG tablet   predniSONE (DELTASONE) 10 MG tablet     Persistent left-sided headache around the eye and temple area.  Unclear etiology.  It certainly could be a sinus infection though she really does not have a lot of congestion or drainage.  Could also be a migraine headache and status migrainous.  We discussed treatment options.  Again I put her on Augmentin as well as a prednisone taper.  During that time and want her to avoid any over-the-counter NSAIDs or Tylenol or Excedrin products.  If she is not significantly better after the weekend then please call me back and we will consider a head CT.  Meds ordered this encounter  Medications  . amoxicillin-clavulanate (AUGMENTIN) 875-125 MG tablet    Sig: Take 1 tablet by mouth 2 (two) times daily.    Dispense:  20 tablet    Refill:  0  . predniSONE (DELTASONE) 10 MG tablet    Sig: 8 tabs po Day 1, 6 tabs Day 2, 4 tabs Day 3, 2 tabs Day 4, 1 tab Day 5    Dispense:  21 tablet    Refill:  0     Nani Gasser, MD

## 2018-05-05 NOTE — Patient Instructions (Signed)
Call on Monday or Tuesday if not any better.

## 2018-05-09 ENCOUNTER — Encounter: Payer: Self-pay | Admitting: Family Medicine

## 2018-05-09 DIAGNOSIS — R51 Headache: Principal | ICD-10-CM

## 2018-05-09 DIAGNOSIS — G4489 Other headache syndrome: Secondary | ICD-10-CM

## 2018-05-09 DIAGNOSIS — R519 Headache, unspecified: Secondary | ICD-10-CM

## 2018-05-14 ENCOUNTER — Encounter: Payer: Self-pay | Admitting: Family Medicine

## 2018-05-14 ENCOUNTER — Ambulatory Visit
Admission: RE | Admit: 2018-05-14 | Discharge: 2018-05-14 | Disposition: A | Discharge status: Home / Self Care | Payer: Medicaid Other | Source: Ambulatory Visit | Attending: Family Medicine | Admitting: Family Medicine

## 2018-05-14 ENCOUNTER — Other Ambulatory Visit: Payer: Self-pay

## 2018-05-14 DIAGNOSIS — G4489 Other headache syndrome: Secondary | ICD-10-CM

## 2018-05-14 MED ORDER — GADOBENATE DIMEGLUMINE 529 MG/ML IV SOLN
18.0000 mL | Freq: Once | INTRAVENOUS | Status: AC | PRN
  Administered 2018-05-14: 18 mL via INTRAVENOUS

## 2018-05-16 MED ORDER — ONDANSETRON HCL 4 MG PO TABS: 4.0000 mg | ORAL_TABLET | Freq: Three times a day (TID) | ORAL | 0 refills | Status: DC | PRN

## 2018-05-16 NOTE — Telephone Encounter (Signed)
rx sent

## 2018-05-22 ENCOUNTER — Encounter: Payer: Self-pay | Admitting: Family Medicine

## 2018-05-23 MED ORDER — FLUOXETINE HCL 10 MG PO CAPS: 20.0000 mg | ORAL_CAPSULE | Freq: Every day | ORAL | 0 refills | Status: DC

## 2018-05-24 ENCOUNTER — Encounter: Payer: Self-pay | Admitting: Family Medicine

## 2018-05-24 MED ORDER — PANTOPRAZOLE SODIUM 40 MG PO TBEC: DELAYED_RELEASE_TABLET | ORAL | 3 refills | Status: DC

## 2018-12-25 ENCOUNTER — Other Ambulatory Visit: Payer: Self-pay | Admitting: Family Medicine

## 2018-12-25 MED ORDER — FLUOXETINE HCL 10 MG PO CAPS: 20.0000 mg | ORAL_CAPSULE | Freq: Every day | ORAL | 0 refills | Status: DC

## 2019-05-08 ENCOUNTER — Encounter: Payer: Self-pay | Admitting: Family Medicine

## 2019-05-08 ENCOUNTER — Telehealth (INDEPENDENT_AMBULATORY_CARE_PROVIDER_SITE_OTHER): Payer: 59 | Admitting: Family Medicine

## 2019-05-08 DIAGNOSIS — Z1231 Encounter for screening mammogram for malignant neoplasm of breast: Secondary | ICD-10-CM

## 2019-05-08 DIAGNOSIS — E876 Hypokalemia: Secondary | ICD-10-CM

## 2019-05-08 DIAGNOSIS — D649 Anemia, unspecified: Secondary | ICD-10-CM

## 2019-05-08 DIAGNOSIS — F419 Anxiety disorder, unspecified: Secondary | ICD-10-CM

## 2019-05-08 DIAGNOSIS — K2100 Gastro-esophageal reflux disease with esophagitis, without bleeding: Secondary | ICD-10-CM

## 2019-05-08 DIAGNOSIS — R030 Elevated blood-pressure reading, without diagnosis of hypertension: Secondary | ICD-10-CM

## 2019-05-08 MED ORDER — PANTOPRAZOLE SODIUM 40 MG PO TBEC: DELAYED_RELEASE_TABLET | ORAL | 3 refills | Status: DC

## 2019-05-08 MED ORDER — FLUOXETINE HCL 10 MG PO CAPS: 10.0000 mg | ORAL_CAPSULE | Freq: Every day | ORAL | 2 refills | Status: DC

## 2019-05-08 NOTE — Assessment & Plan Note (Signed)
Doing really well on fluoxetine 10 mg.  Since she did not tolerate a higher dose we could always try different medication if she would like any point but right now she feels like her dose is therapeutic.  We will continue current regimen and follow-up in 1 year.

## 2019-05-08 NOTE — Progress Notes (Signed)
Virtual Visit via Video Note  I connected with Shannon Clarke on 05/08/19 at  3:40 PM EST by a video enabled telemedicine application and verified that I am speaking with the correct person using two identifiers.   I discussed the limitations of evaluation and management by telemedicine and the availability of in person appointments. The patient expressed understanding and agreed to proceed.  Subjective:    CC: F/U anxiety  HPI: Follow-up anxiety-overall she is actually doing really well.  She is actually taking 10 mg of fluoxetine instead of 20 mg.  She said she just did not feel right when she was taking 20 mg so went back down.  Right now she feels like that is actually doing well to control her mood.  It was a very stressful year last year during Covid as she lost her job.  But more recently she found a new job and she is been able to work from home which has been really great for her.  Follow-up GERD-taking the Protonix daily.  We had discussed previously about the potential risks with long-term use and she is aware.  She would like a refill on the medication.  She does also want to give me a quick update.  She been having some persistent headaches and migraines last year.  She finally went to see a chiropractor and has been getting significant relief.  She is also been working on her workstation at home to make sure that it is ergonomic and not causing extra strain on her neck.  She has never had a mammogram.   Past medical history, Surgical history, Family history not pertinant except as noted below, Social history, Allergies, and medications have been entered into the medical record, reviewed, and corrections made.   Review of Systems: No fevers, chills, night sweats, weight loss, chest pain, or shortness of breath.   Objective:    General: Speaking clearly in complete sentences without any shortness of breath.  Alert and oriented x3.  Normal judgment. No apparent acute  distress.    Impression and Recommendations:    GERD (gastroesophageal reflux disease) Continuing to take daily.  Refill sent for 1 year.  Just reminded her about potential long-term risks with persistent use of PPIs.  Anxiety Doing really well on fluoxetine 10 mg.  Since she did not tolerate a higher dose we could always try different medication if she would like any point but right now she feels like her dose is therapeutic.  We will continue current regimen and follow-up in 1 year.   Did encourage her to call her health insurance to see if a physical with blood work is covered under her current plan.   Time spent in encounter 21 minutes  I discussed the assessment and treatment plan with the patient. The patient was provided an opportunity to ask questions and all were answered. The patient agreed with the plan and demonstrated an understanding of the instructions.   The patient was advised to call back or seek an in-person evaluation if the symptoms worsen or if the condition fails to improve as anticipated.   Nani Gasser, MD

## 2019-05-08 NOTE — Assessment & Plan Note (Signed)
Continuing to take daily.  Refill sent for 1 year.  Just reminded her about potential long-term risks with persistent use of PPIs.

## 2019-05-25 ENCOUNTER — Ambulatory Visit
Admission: RE | Admit: 2019-05-25 | Discharge: 2019-05-25 | Disposition: A | Discharge status: Home / Self Care | Payer: 59 | Source: Ambulatory Visit | Attending: Family Medicine | Admitting: Family Medicine

## 2019-05-25 ENCOUNTER — Other Ambulatory Visit: Payer: Self-pay

## 2019-05-25 DIAGNOSIS — Z1231 Encounter for screening mammogram for malignant neoplasm of breast: Secondary | ICD-10-CM

## 2019-08-28 ENCOUNTER — Other Ambulatory Visit: Payer: Self-pay

## 2019-08-28 ENCOUNTER — Encounter: Payer: Self-pay | Admitting: Family Medicine

## 2019-08-28 DIAGNOSIS — K2100 Gastro-esophageal reflux disease with esophagitis, without bleeding: Secondary | ICD-10-CM

## 2019-08-28 MED ORDER — PANTOPRAZOLE SODIUM 40 MG PO TBEC: DELAYED_RELEASE_TABLET | ORAL | 3 refills | Status: DC

## 2020-02-09 ENCOUNTER — Other Ambulatory Visit: Payer: Self-pay | Admitting: Family Medicine

## 2020-02-10 ENCOUNTER — Encounter: Payer: Self-pay | Admitting: Family Medicine

## 2020-02-11 ENCOUNTER — Other Ambulatory Visit: Payer: Self-pay | Admitting: *Deleted

## 2020-05-22 ENCOUNTER — Other Ambulatory Visit: Payer: Self-pay | Admitting: Family Medicine

## 2020-05-22 DIAGNOSIS — Z1231 Encounter for screening mammogram for malignant neoplasm of breast: Secondary | ICD-10-CM

## 2020-07-15 ENCOUNTER — Ambulatory Visit
Admission: RE | Admit: 2020-07-15 | Discharge: 2020-07-15 | Disposition: A | Discharge status: Home / Self Care | Payer: 59 | Source: Ambulatory Visit | Attending: Family Medicine | Admitting: Family Medicine

## 2020-07-15 ENCOUNTER — Other Ambulatory Visit: Payer: Self-pay

## 2020-07-15 DIAGNOSIS — Z1231 Encounter for screening mammogram for malignant neoplasm of breast: Secondary | ICD-10-CM

## 2020-10-14 ENCOUNTER — Other Ambulatory Visit: Payer: Self-pay | Admitting: Family Medicine

## 2021-06-02 ENCOUNTER — Other Ambulatory Visit: Payer: Self-pay | Admitting: Family Medicine

## 2021-06-02 DIAGNOSIS — Z1231 Encounter for screening mammogram for malignant neoplasm of breast: Secondary | ICD-10-CM

## 2021-06-24 ENCOUNTER — Encounter: Payer: Self-pay | Admitting: Family Medicine

## 2021-06-24 DIAGNOSIS — K2100 Gastro-esophageal reflux disease with esophagitis, without bleeding: Secondary | ICD-10-CM

## 2021-06-25 NOTE — Telephone Encounter (Signed)
Does she have an upcoming appointment?  This patient has not been seen since 2021.  Unless I am overlooking a visit. ?

## 2021-07-16 ENCOUNTER — Ambulatory Visit: Payer: 59

## 2021-07-17 ENCOUNTER — Other Ambulatory Visit: Payer: Self-pay | Admitting: Family Medicine

## 2021-07-17 NOTE — Telephone Encounter (Signed)
Please call pt she will need an appointment for refills on this medication.

## 2021-07-17 NOTE — Telephone Encounter (Signed)
LVM for patient to call back to get f/u appt scheduled for any further med refills. AMUCK

## 2021-07-20 ENCOUNTER — Ambulatory Visit: Payer: 59

## 2021-07-23 ENCOUNTER — Ambulatory Visit
Admission: RE | Admit: 2021-07-23 | Discharge: 2021-07-23 | Disposition: A | Discharge status: Home / Self Care | Payer: 59 | Source: Ambulatory Visit | Attending: Family Medicine | Admitting: Family Medicine

## 2021-07-23 DIAGNOSIS — Z1231 Encounter for screening mammogram for malignant neoplasm of breast: Secondary | ICD-10-CM

## 2021-07-24 NOTE — Progress Notes (Signed)
Please call patient. Normal mammogram.  Repeat in 1 year.  

## 2021-08-10 ENCOUNTER — Telehealth: Payer: Self-pay | Admitting: Family Medicine

## 2021-08-10 ENCOUNTER — Encounter: Payer: Self-pay | Admitting: Family Medicine

## 2021-08-10 ENCOUNTER — Other Ambulatory Visit: Payer: Self-pay | Admitting: Family Medicine

## 2021-08-10 DIAGNOSIS — K2100 Gastro-esophageal reflux disease with esophagitis, without bleeding: Secondary | ICD-10-CM

## 2021-08-10 MED ORDER — FLUOXETINE HCL 10 MG PO CAPS: 10.0000 mg | ORAL_CAPSULE | Freq: Every day | ORAL | 0 refills | Status: DC

## 2021-08-10 MED ORDER — PANTOPRAZOLE SODIUM 40 MG PO TBEC: DELAYED_RELEASE_TABLET | ORAL | 0 refills | Status: DC

## 2021-08-10 NOTE — Telephone Encounter (Signed)
30 day supply sent util her appt.   She hasn't been seen in 2 years so we can't send 90 day until she is seen.

## 2021-08-10 NOTE — Telephone Encounter (Signed)
Pt called for refill on her protonix and prozac. Her appointment is scheduled for July 21.

## 2021-08-10 NOTE — Telephone Encounter (Signed)
Addressed in separate phone note.

## 2021-09-18 ENCOUNTER — Ambulatory Visit: Payer: 59 | Admitting: Family Medicine

## 2021-09-18 ENCOUNTER — Other Ambulatory Visit: Payer: Self-pay | Admitting: Family Medicine

## 2021-09-18 ENCOUNTER — Encounter: Payer: Self-pay | Admitting: Family Medicine

## 2021-09-18 VITALS — BP 148/88 | HR 118 | Ht 61.0 in | Wt 236.0 lb

## 2021-09-18 DIAGNOSIS — Z79899 Other long term (current) drug therapy: Secondary | ICD-10-CM

## 2021-09-18 DIAGNOSIS — Z13220 Encounter for screening for lipoid disorders: Secondary | ICD-10-CM | POA: Diagnosis not present

## 2021-09-18 DIAGNOSIS — F419 Anxiety disorder, unspecified: Secondary | ICD-10-CM | POA: Diagnosis not present

## 2021-09-18 DIAGNOSIS — K2100 Gastro-esophageal reflux disease with esophagitis, without bleeding: Secondary | ICD-10-CM

## 2021-09-18 DIAGNOSIS — R030 Elevated blood-pressure reading, without diagnosis of hypertension: Secondary | ICD-10-CM

## 2021-09-18 MED ORDER — PANTOPRAZOLE SODIUM 20 MG PO TBEC: 20.0000 mg | DELAYED_RELEASE_TABLET | Freq: Every day | ORAL | 0 refills | Status: DC

## 2021-09-18 MED ORDER — PANTOPRAZOLE SODIUM 40 MG PO TBEC: DELAYED_RELEASE_TABLET | ORAL | 3 refills | Status: DC

## 2021-09-18 NOTE — Assessment & Plan Note (Signed)
She is doing well on 40 mg every other day we will try to decrease down to 20 mg.  New prescription sent to pharmacy.

## 2021-09-18 NOTE — Assessment & Plan Note (Signed)
Blood pressure was still elevated today.  She reports home blood pressures in the 130s over 90s.  We discussed that this is still elevated.  She really wants to make some dietary changes also discussed the Dash diet and additional handout information given.  If not improving then she will need to consider starting blood pressure medication and we discussed that today.

## 2021-09-18 NOTE — Assessment & Plan Note (Signed)
Ding well on fluoxetine.  F/U in 6-12 months.  sTable on current regimen.

## 2021-09-18 NOTE — Progress Notes (Signed)
Established Patient Office Visit  Subjective   Patient ID: Shannon Clarke, female    DOB: [birth year]  Age: 46 y.o. MRN: [id number]  Chief Complaint  Patient presents with   mood    anxiety    HPI   F/U Anxiety - On fluxoetine 10 mg daily he really feels like this is just enough to come to keep her mood in a good place and she has had some stressors recently.  She has been trying to help her mom out who has been having some major hip problems.  And she had an aunt that was just diagnosed with liposarcoma.  She does work from home and is not as physically active since working at home and has gained a little bit of weight but plans on getting back on track with that.  She has been tracking her blood pressures at home and they have been running in the low 130 range over 90.  F/U GERD -she is doing really well on the pantoprazole 40 mg in fact most of the time she is taking it every other day.  Occasionally she might take it daily but not very often and sometimes she is even able to stretch it to every 2 or 3 days.      ROS    Objective:     BP (!) 148/88   Pulse (!) 118   Ht 5\' 1"  (1.549 m)   Wt 236 lb (107 kg)   SpO2 98%   BMI 44.59 kg/m    Physical Exam Vitals and nursing note reviewed.  Constitutional:      Appearance: She is well-developed.  HENT:     Head: Normocephalic and atraumatic.  Cardiovascular:     Rate and Rhythm: Normal rate and regular rhythm.     Heart sounds: Normal heart sounds.  Pulmonary:     Effort: Pulmonary effort is normal.     Breath sounds: Normal breath sounds.  Skin:    General: Skin is warm and dry.  Neurological:     Mental Status: She is alert and oriented to person, place, and time.  Psychiatric:        Behavior: Behavior normal.     No results found for any visits on 09/18/21.    The ASCVD Risk score (Arnett DK, et al., 2019) failed to calculate for the following reasons:   Cannot find a previous HDL lab   Cannot find a  previous total cholesterol lab    Assessment & Plan:   Problem List Items Addressed This Visit       Digestive   GERD (gastroesophageal reflux disease) - Primary    She is doing well on 40 mg every other day we will try to decrease down to 20 mg.  New prescription sent to pharmacy.      Relevant Medications   pantoprazole (PROTONIX) 20 MG tablet   Other Relevant Orders   Lipid Panel w/reflex Direct LDL   COMPLETE METABOLIC PANEL WITH GFR   CBC     Other   Elevated BP without diagnosis of hypertension    Blood pressure was still elevated today.  She reports home blood pressures in the 130s over 90s.  We discussed that this is still elevated.  She really wants to make some dietary changes also discussed the Dash diet and additional handout information given.  If not improving then she will need to consider starting blood pressure medication and we discussed that today.  Anxiety    Ding well on fluoxetine.  F/U in 6-12 months.  sTable on current regimen.        Relevant Orders   Lipid Panel w/reflex Direct LDL   COMPLETE METABOLIC PANEL WITH GFR   CBC   Other Visit Diagnoses     Screening, lipid       Relevant Orders   Lipid Panel w/reflex Direct LDL   Medication management           Return in about 1 year (around 09/19/2022) for Anxiety and GERD.    Nani Gasser, MD

## 2021-09-19 ENCOUNTER — Other Ambulatory Visit: Payer: Self-pay | Admitting: Family Medicine

## 2021-09-19 DIAGNOSIS — K2100 Gastro-esophageal reflux disease with esophagitis, without bleeding: Secondary | ICD-10-CM

## 2021-09-25 ENCOUNTER — Encounter: Payer: Self-pay | Admitting: Family Medicine

## 2021-09-25 DIAGNOSIS — L719 Rosacea, unspecified: Secondary | ICD-10-CM

## 2021-09-25 MED ORDER — ONDANSETRON HCL 4 MG PO TABS: 4.0000 mg | ORAL_TABLET | Freq: Three times a day (TID) | ORAL | 1 refills | Status: DC | PRN

## 2021-09-25 MED ORDER — METRONIDAZOLE 0.75 % EX CREA: TOPICAL_CREAM | Freq: Two times a day (BID) | CUTANEOUS | 5 refills | Status: DC

## 2021-10-09 ENCOUNTER — Encounter: Payer: Self-pay | Admitting: Sports Medicine

## 2021-10-09 ENCOUNTER — Ambulatory Visit: Payer: 59 | Admitting: Sports Medicine

## 2021-10-09 ENCOUNTER — Ambulatory Visit (INDEPENDENT_AMBULATORY_CARE_PROVIDER_SITE_OTHER): Payer: 59

## 2021-10-09 DIAGNOSIS — M5416 Radiculopathy, lumbar region: Secondary | ICD-10-CM

## 2021-10-09 MED ORDER — PREDNISONE 50 MG PO TABS: ORAL_TABLET | ORAL | 0 refills | Status: DC

## 2021-10-09 MED ORDER — MELOXICAM 15 MG PO TABS: ORAL_TABLET | ORAL | 3 refills | Status: DC

## 2021-10-09 NOTE — Progress Notes (Signed)
    Procedures performed today:    None.  Independent interpretation of notes and tests performed by another provider:   None.  Brief History, Exam, Impression, and Recommendations:    Right lumbar radiculitis This is a pleasant 46 year old female, she has had a month of pain low back with radiation down the right leg in an L5 distribution. No progressive weakness, no bowel or bladder dysfunction, no saddle numbness, no constitutional symptoms, no trauma. Adding 5 days of prednisone, followed by meloxicam, I would like x-rays, home physical therapy, return to see me in 6 weeks, MRI for interventional planning if no better.    ____________________________________________ Ihor Austin. Benjamin Stain, M.D., ABFM., CAQSM., AME. Primary Care and Sports Medicine [facility]  Adjunct Professor of Family Medicine  [employer]  Restaurant manager, fast food

## 2021-10-09 NOTE — Assessment & Plan Note (Signed)
This is a pleasant 46 year old female, she has had a month of pain low back with radiation down the right leg in an L5 distribution. No progressive weakness, no bowel or bladder dysfunction, no saddle numbness, no constitutional symptoms, no trauma. Adding 5 days of prednisone, followed by meloxicam, I would like x-rays, home physical therapy, return to see me in 6 weeks, MRI for interventional planning if no better.

## 2021-10-23 ENCOUNTER — Ambulatory Visit: Payer: 59 | Admitting: Family Medicine

## 2021-10-23 ENCOUNTER — Ambulatory Visit: Payer: 59 | Admitting: Sports Medicine

## 2021-11-07 ENCOUNTER — Other Ambulatory Visit: Payer: Self-pay | Admitting: Family Medicine

## 2021-11-14 ENCOUNTER — Other Ambulatory Visit: Payer: Self-pay | Admitting: Family Medicine

## 2021-11-20 ENCOUNTER — Ambulatory Visit: Payer: 59 | Admitting: Sports Medicine

## 2021-11-20 DIAGNOSIS — M5416 Radiculopathy, lumbar region: Secondary | ICD-10-CM | POA: Diagnosis not present

## 2021-11-20 NOTE — Progress Notes (Signed)
    Procedures performed today:    None.  Independent interpretation of notes and tests performed by another provider:   None.  Brief History, Exam, Impression, and Recommendations:    Right lumbar radiculitis Shannon Clarke returns, I saw her about 6 weeks ago for lumbar radiculitis, right-sided, she has improved considerably with prednisone, home PT. X-rays did show L5-S1 DDD. She still has an occasional cramp in the right calf when she has been sitting for a long period of time, she will switch to a stand-up workstation and if persistent discomfort after about 4-6 more weeks we will proceed with MRI and epidural. Otherwise return to see me as needed.    ____________________________________________ [name]. Dianah Field, M.D., ABFM., CAQSM., AME. Primary Care and Sports Medicine [facility]  Adjunct Professor of [employer]  Risk manager

## 2021-11-20 NOTE — Assessment & Plan Note (Signed)
Shannon Clarke returns, I saw her about 6 weeks ago for lumbar radiculitis, right-sided, she has improved considerably with prednisone, home PT. X-rays did show L5-S1 DDD. She still has an occasional cramp in the right calf when she has been sitting for a long period of time, she will switch to a stand-up workstation and if persistent discomfort after about 4-6 more weeks we will proceed with MRI and epidural. Otherwise return to see me as needed.

## 2021-11-22 ENCOUNTER — Other Ambulatory Visit: Payer: Self-pay | Admitting: Family Medicine

## 2022-03-16 ENCOUNTER — Other Ambulatory Visit: Payer: Self-pay | Admitting: *Deleted

## 2022-03-16 ENCOUNTER — Other Ambulatory Visit: Payer: Self-pay | Admitting: Family Medicine

## 2022-04-22 ENCOUNTER — Ambulatory Visit: Payer: 59 | Admitting: Family Medicine

## 2022-04-22 ENCOUNTER — Encounter: Payer: Self-pay | Admitting: Family Medicine

## 2022-04-22 VITALS — BP 140/76 | HR 101 | Temp 98.5°F | Ht 61.0 in | Wt 245.0 lb

## 2022-04-22 DIAGNOSIS — H9203 Otalgia, bilateral: Secondary | ICD-10-CM

## 2022-04-22 DIAGNOSIS — H8111 Benign paroxysmal vertigo, right ear: Secondary | ICD-10-CM | POA: Diagnosis not present

## 2022-04-22 NOTE — Progress Notes (Signed)
   Acute Office Visit  Subjective:     Patient ID: Shannon Clarke, female    DOB: December 21, 1975, 47 y.o.   MRN: [id number]  Chief Complaint  Patient presents with   ear pain    HPI Patient is in today for bilateral ear pain. Did an e visit and given Augmentin and almost done with the medication.    She says in general her ears are feeling better she is just having dizziness that is been going on for at least a week.  It does not seem to be easing off.  She has not noticed if it is worse rotating 1 way versus the other but it is definitely triggered by flexion and extension of the neck.  ROS      Objective:    BP (!) 140/76   Pulse (!) 101   Temp 98.5 F (36.9 C)   Ht 5' 1"$  (1.549 m)   Wt 245 lb (111.1 kg)   SpO2 98%   BMI 46.29 kg/m    Physical Exam Constitutional:      Appearance: She is well-developed.  HENT:     Head: Normocephalic and atraumatic.     Right Ear: Tympanic membrane, ear canal and external ear normal.     Left Ear: Tympanic membrane, ear canal and external ear normal.     Nose: Nose normal.     Mouth/Throat:     Pharynx: Oropharynx is clear.  Eyes:     Conjunctiva/sclera: Conjunctivae normal.     Pupils: Pupils are equal, round, and reactive to light.  Neck:     Thyroid: No thyromegaly.  Cardiovascular:     Rate and Rhythm: Normal rate and regular rhythm.     Heart sounds: Normal heart sounds.  Pulmonary:     Effort: Pulmonary effort is normal.     Breath sounds: Normal breath sounds. No wheezing.  Musculoskeletal:     Cervical back: Neck supple.  Lymphadenopathy:     Cervical: No cervical adenopathy.  Skin:    General: Skin is warm and dry.  Neurological:     Mental Status: She is alert and oriented to person, place, and time.     Comments: Dix-Hallpike maneuver to the right recreated her symptoms.  It was difficult to detect any true nystagmus on exam.  She had some mild symptoms to the left.  They resolved when she sat up.     No results  found for any visits on 04/22/22.      Assessment & Plan:   Problem List Items Addressed This Visit   None Visit Diagnoses     BPPV (benign paroxysmal positional vertigo), right    -  Primary   Relevant Orders   Ambulatory referral to Physical Therapy   Ear pain, bilateral           Bilateral ear pain-overall the ear discomfort is better it is just the vertigo that is remaining.  Make sure to complete the Augmentin she only has about 1 day left.  BPPV, right-we will go ahead and place referral for vestibular rehab.  In meantime I did give her handout with exercises to do on her own at home and reviewed those with her.  Okay to use nondrowsy Dramamine if needed.  No orders of the defined types were placed in this encounter.   No follow-ups on file.  Beatrice Lecher, MD

## 2022-05-02 ENCOUNTER — Encounter: Payer: Self-pay | Admitting: Family Medicine

## 2022-05-02 DIAGNOSIS — B370 Candidal stomatitis: Secondary | ICD-10-CM

## 2022-05-03 ENCOUNTER — Other Ambulatory Visit: Payer: Self-pay | Admitting: Family Medicine

## 2022-05-03 DIAGNOSIS — B370 Candidal stomatitis: Secondary | ICD-10-CM

## 2022-05-03 MED ORDER — NYSTATIN 100000 UNIT/ML MT SUSP: 5.0000 mL | Freq: Four times a day (QID) | OROMUCOSAL | 0 refills | Status: DC

## 2022-05-03 NOTE — Telephone Encounter (Signed)
Resent to [facility].  I have not heard of any backorder on this medication so I find it strange that were just, try to send it to [facility].  Meds ordered this encounter  Medications   nystatin (MYCOSTATIN) 100000 UNIT/ML suspension    Sig: Take 5 mLs (500,000 Units total) by mouth 4 (four) times daily. X 7 days    Dispense:  473 mL    Refill:  0

## 2022-07-06 ENCOUNTER — Other Ambulatory Visit: Payer: Self-pay | Admitting: Family Medicine

## 2022-07-06 DIAGNOSIS — Z1231 Encounter for screening mammogram for malignant neoplasm of breast: Secondary | ICD-10-CM

## 2022-07-30 ENCOUNTER — Ambulatory Visit
Admission: RE | Admit: 2022-07-30 | Discharge: 2022-07-30 | Disposition: A | Discharge status: Home / Self Care | Payer: 59 | Source: Ambulatory Visit | Attending: Family Medicine | Admitting: Family Medicine

## 2022-07-30 DIAGNOSIS — Z1231 Encounter for screening mammogram for malignant neoplasm of breast: Secondary | ICD-10-CM

## 2022-08-03 NOTE — Progress Notes (Signed)
Please call patient. Normal mammogram.  Repeat in 1 year.  

## 2022-09-22 ENCOUNTER — Encounter: Payer: Self-pay | Admitting: Family Medicine

## 2022-09-24 ENCOUNTER — Other Ambulatory Visit: Payer: Self-pay | Admitting: Family Medicine

## 2022-09-24 MED ORDER — ONDANSETRON HCL 4 MG PO TABS: 4.0000 mg | ORAL_TABLET | Freq: Three times a day (TID) | ORAL | 1 refills | Status: DC | PRN

## 2022-09-24 MED ORDER — FLUOXETINE HCL 10 MG PO CAPS: ORAL_CAPSULE | ORAL | 0 refills | Status: DC

## 2022-09-24 NOTE — Telephone Encounter (Signed)
Meds sent but please have her try to schedule follow-up sometime in early September for mood follow-up  Meds ordered this encounter  Medications   FLUoxetine (PROZAC) 10 MG capsule    Sig: TAKE 1 CAPSULE BY MOUTH EVERY DAY    Dispense:  90 capsule    Refill:  0   ondansetron (ZOFRAN) 4 MG tablet    Sig: Take 1 tablet (4 mg total) by mouth every 8 (eight) hours as needed for nausea or vomiting.    Dispense:  12 tablet    Refill:  1   .

## 2022-10-29 IMAGING — MG MM DIGITAL SCREENING BILAT W/ TOMO AND CAD
6 of 12 series · 6 of 36 positions shown · non-contrast
Comparison: Previous exam(s).

ACR Breast Density Category a: The breast tissue is almost entirely
fatty.

CLINICAL DATA: Screening.

EXAM:
DIGITAL SCREENING BILATERAL MAMMOGRAM WITH TOMOSYNTHESIS AND CAD
TECHNIQUE: Bilateral screening digital craniocaudal and mediolateral oblique
mammograms were obtained. Bilateral screening digital breast
tomosynthesis was performed. The images were evaluated with
computer-aided detection.

[L MLO synth-2D (1 of 2)]
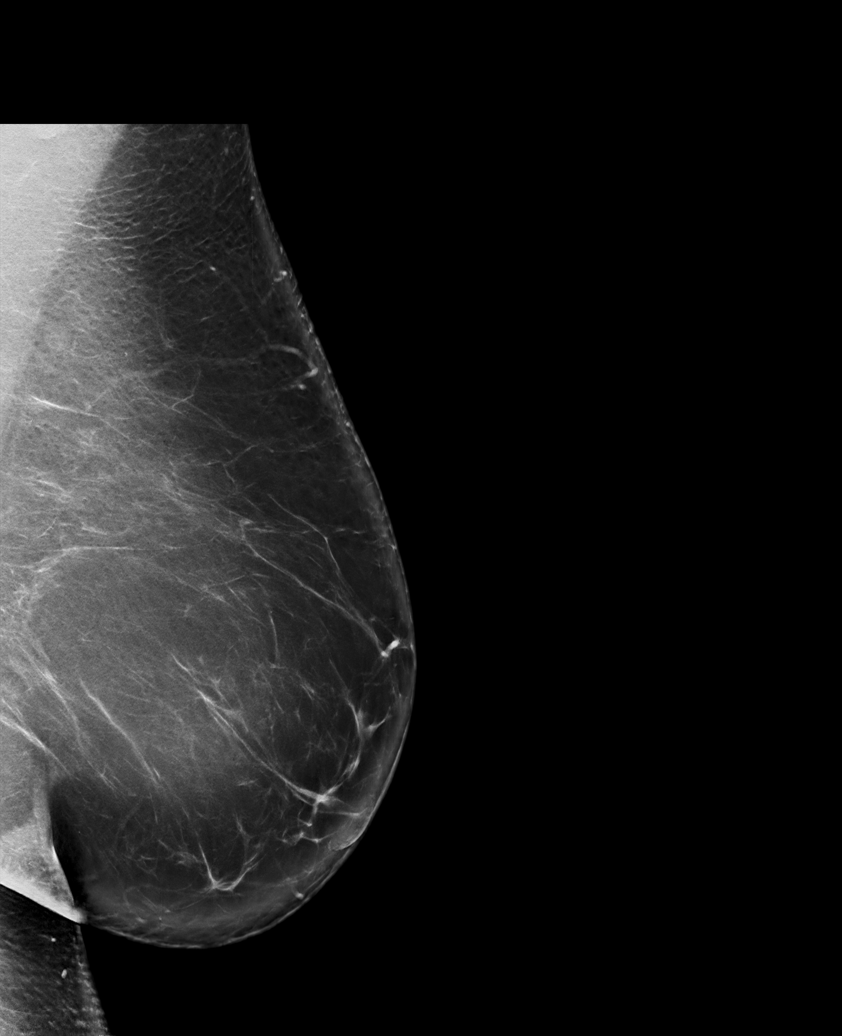

[L CC synth-2D]
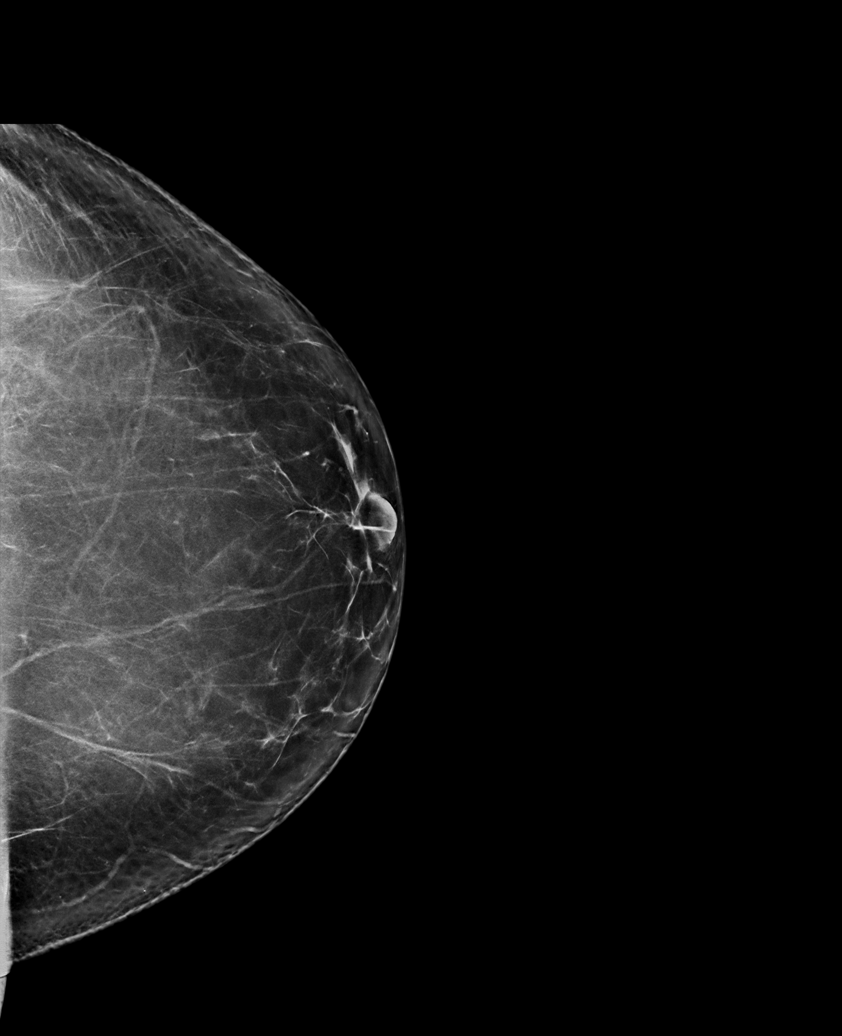

[R MLO synth-2D]
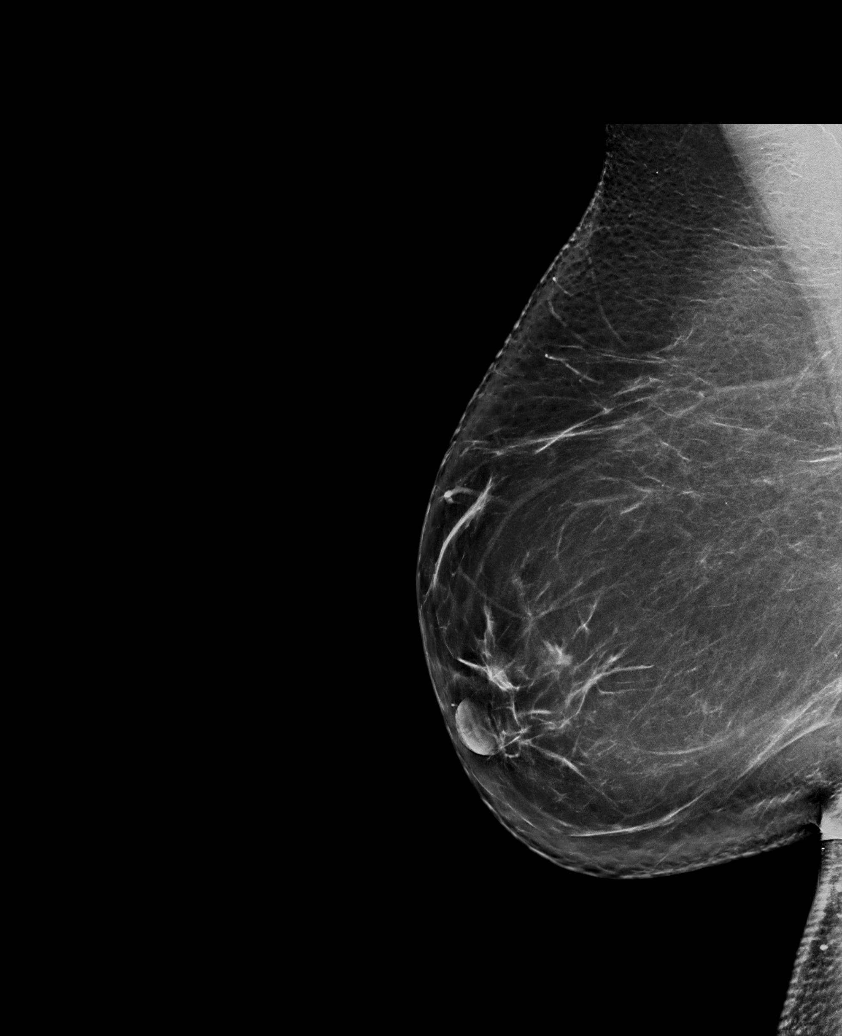

[L MLO synth-2D (2 of 2)]
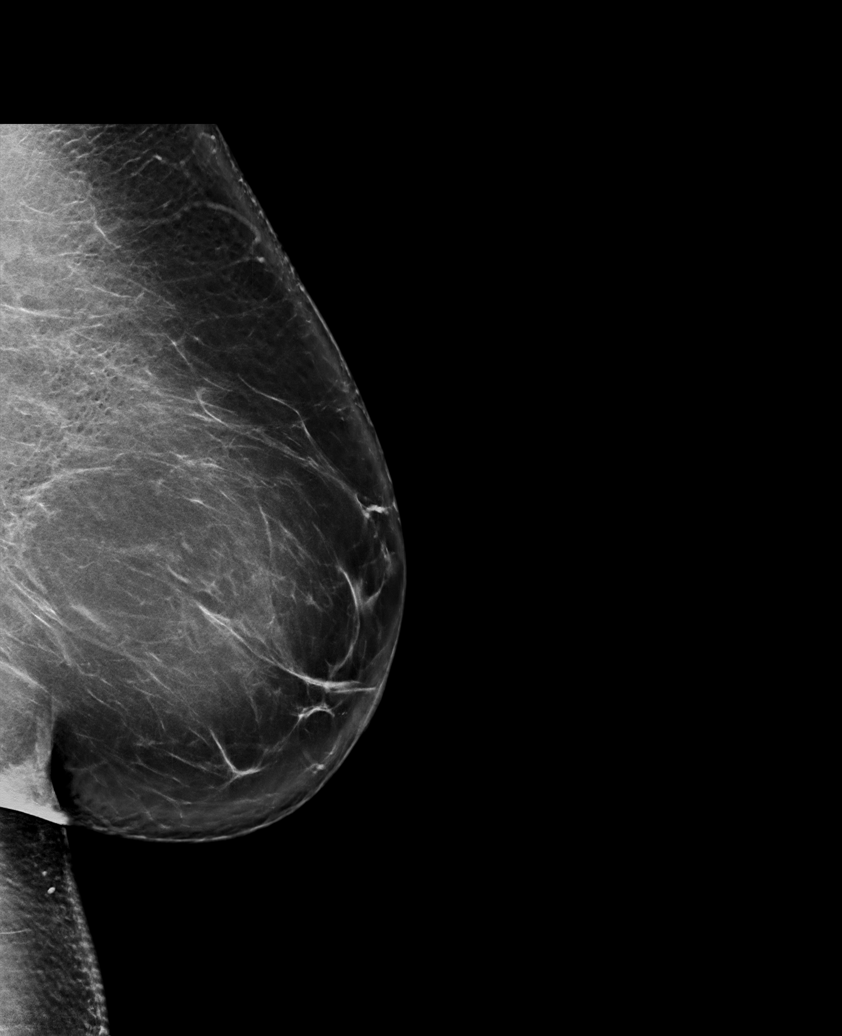

[R CC synth-2D (1 of 2)]
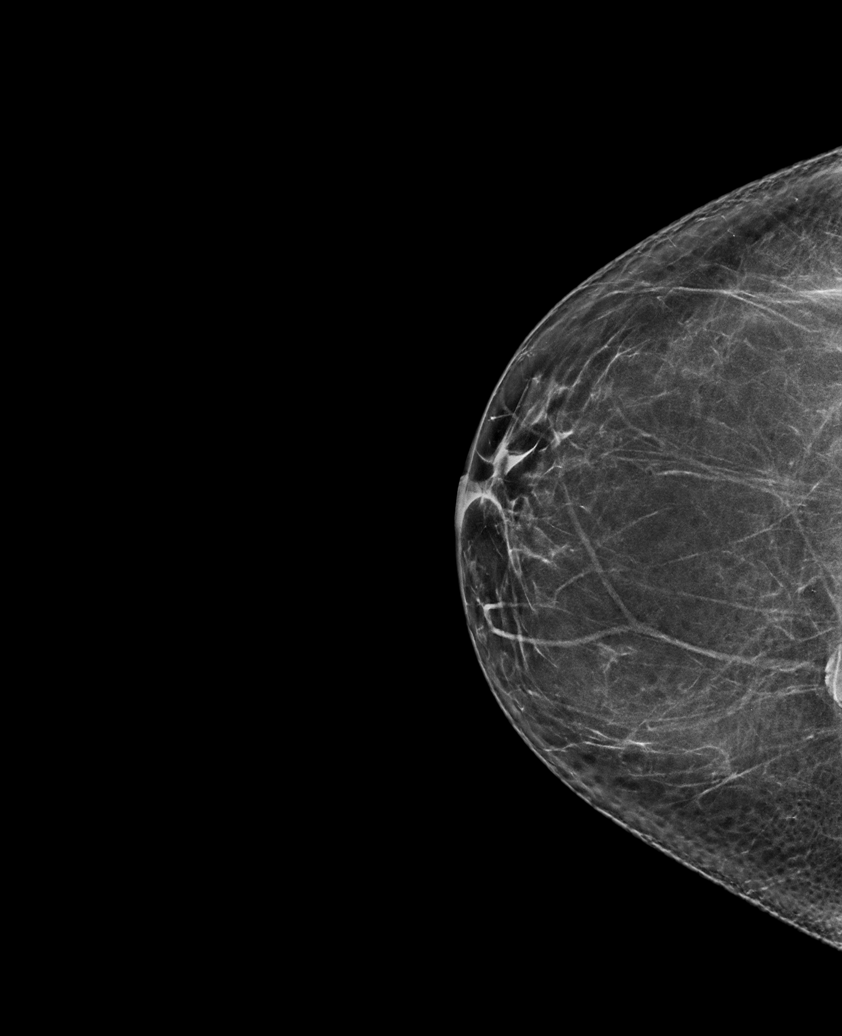

[R CC synth-2D (2 of 2)]
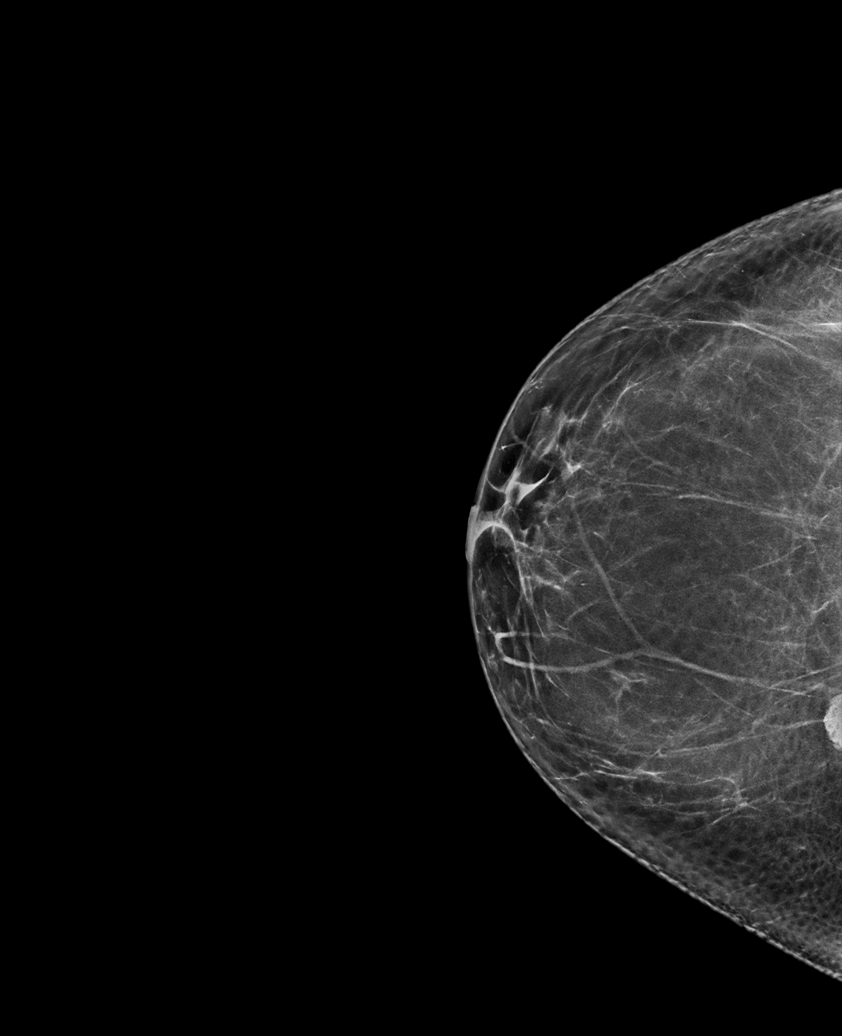

[6 of 36 positions shown; findings below may reference images not displayed]

FINDINGS: There are no findings suspicious for malignancy.
IMPRESSION: No mammographic evidence of malignancy. A result letter of this
screening mammogram will be mailed directly to the patient.

RECOMMENDATION:
Screening mammogram in one year. (Code:0E-3-N98)

BI-RADS CATEGORY  1: Negative.

## 2022-12-24 ENCOUNTER — Other Ambulatory Visit: Payer: Self-pay | Admitting: Family Medicine

## 2022-12-29 ENCOUNTER — Other Ambulatory Visit: Payer: Self-pay | Admitting: Family Medicine

## 2023-01-21 ENCOUNTER — Ambulatory Visit: Payer: 59 | Admitting: Sports Medicine

## 2023-02-11 ENCOUNTER — Other Ambulatory Visit: Payer: Self-pay | Admitting: Family Medicine

## 2023-03-30 ENCOUNTER — Other Ambulatory Visit: Payer: Self-pay | Admitting: Family Medicine

## 2023-03-30 NOTE — Telephone Encounter (Signed)
Please call pt and have her schedule a f/u appt for refills on Fluoxetine she is overdue. Thanks.

## 2023-03-31 NOTE — Telephone Encounter (Signed)
Called patient she now scheduled for a follow up appointment

## 2023-04-19 ENCOUNTER — Ambulatory Visit: Payer: 59 | Admitting: Family Medicine

## 2023-05-06 ENCOUNTER — Other Ambulatory Visit: Payer: Self-pay | Admitting: Family Medicine

## 2023-05-16 ENCOUNTER — Ambulatory Visit: Payer: 59 | Admitting: Family Medicine

## 2023-05-16 DIAGNOSIS — R030 Elevated blood-pressure reading, without diagnosis of hypertension: Secondary | ICD-10-CM

## 2023-05-16 DIAGNOSIS — Z13220 Encounter for screening for lipoid disorders: Secondary | ICD-10-CM

## 2023-06-20 ENCOUNTER — Ambulatory Visit: Admitting: Family Medicine

## 2023-07-21 ENCOUNTER — Telehealth: Payer: Self-pay | Admitting: Family Medicine

## 2023-07-21 ENCOUNTER — Ambulatory Visit: Admitting: Family Medicine

## 2023-07-21 NOTE — Telephone Encounter (Signed)
Pls call pt

## 2023-07-21 NOTE — Progress Notes (Deleted)
   Established Patient Office Visit  Subjective  Patient ID: KELBY LOTSPEICH, female    DOB: [birth year]  Age: 48 y.o. MRN: [id number]  No chief complaint on file.   HPI  Here for f/u Anxiety - currently on fluoxetine  10mg .    F/U GERD - on Protonix  20mg    {History (Optional):23778}  ROS    Objective:     There were no vitals taken for this visit. {Vitals History (Optional):23777}  Physical Exam Vitals and nursing note reviewed.  Constitutional:      Appearance: Normal appearance.  HENT:     Head: Normocephalic and atraumatic.  Eyes:     Conjunctiva/sclera: Conjunctivae normal.  Cardiovascular:     Rate and Rhythm: Normal rate and regular rhythm.  Pulmonary:     Effort: Pulmonary effort is normal.     Breath sounds: Normal breath sounds.  Skin:    General: Skin is warm and dry.  Neurological:     Mental Status: She is alert.  Psychiatric:        Mood and Affect: Mood normal.    No results found for any visits on 07/21/23.  {Labs (Optional):23779}  The ASCVD Risk score (Arnett DK, et al., 2019) failed to calculate for the following reasons:   The systolic blood pressure is missing   Cannot find a previous HDL lab   Cannot find a previous total cholesterol lab    Assessment & Plan:   Problem List Items Addressed This Visit       Digestive   GERD (gastroesophageal reflux disease)     Other   Anxiety - Primary    No follow-ups on file.    Duaine German, MD

## 2023-07-21 NOTE — Telephone Encounter (Signed)
 Attempted call to patient. Voice mail not yet set up. Could not leave a voicemail message.

## 2023-07-21 NOTE — Telephone Encounter (Signed)
 Copied from CRM [id number]. Topic: General - Other >> Jul 21, 2023  4:01 PM Brynn Caras wrote: Reason for CRM: The patient is requesting a direct callback from Dr.Metheney for an unspecified reason.  Callback [phone]

## 2023-07-27 NOTE — Telephone Encounter (Signed)
 Attempted call to patient. Again mail box is full. Could not leave a voice mail message.

## 2023-08-22 ENCOUNTER — Ambulatory Visit: Admitting: Family Medicine

## 2023-08-22 ENCOUNTER — Telehealth: Payer: Self-pay | Admitting: Family Medicine

## 2023-08-22 ENCOUNTER — Encounter: Payer: Self-pay | Admitting: Family Medicine

## 2023-08-22 VITALS — BP 188/76 | HR 111 | Ht 61.0 in | Wt 224.1 lb

## 2023-08-22 DIAGNOSIS — R6884 Jaw pain: Secondary | ICD-10-CM

## 2023-08-22 DIAGNOSIS — E876 Hypokalemia: Secondary | ICD-10-CM

## 2023-08-22 DIAGNOSIS — I10 Essential (primary) hypertension: Secondary | ICD-10-CM

## 2023-08-22 DIAGNOSIS — R519 Headache, unspecified: Secondary | ICD-10-CM | POA: Diagnosis not present

## 2023-08-22 DIAGNOSIS — G479 Sleep disorder, unspecified: Secondary | ICD-10-CM

## 2023-08-22 DIAGNOSIS — R6889 Other general symptoms and signs: Secondary | ICD-10-CM

## 2023-08-22 DIAGNOSIS — F321 Major depressive disorder, single episode, moderate: Secondary | ICD-10-CM

## 2023-08-22 DIAGNOSIS — R8290 Unspecified abnormal findings in urine: Secondary | ICD-10-CM

## 2023-08-22 DIAGNOSIS — R4589 Other symptoms and signs involving emotional state: Secondary | ICD-10-CM

## 2023-08-22 DIAGNOSIS — F329 Major depressive disorder, single episode, unspecified: Secondary | ICD-10-CM | POA: Insufficient documentation

## 2023-08-22 MED ORDER — FLUOXETINE HCL 20 MG PO CAPS: 20.0000 mg | ORAL_CAPSULE | Freq: Every day | ORAL | 1 refills | Status: DC

## 2023-08-22 MED ORDER — FLUTICASONE PROPIONATE 50 MCG/ACT NA SUSP: 2.0000 | Freq: Every day | NASAL | 1 refills | Status: AC

## 2023-08-22 MED ORDER — LOSARTAN POTASSIUM 25 MG PO TABS: 25.0000 mg | ORAL_TABLET | Freq: Every day | ORAL | 1 refills | Status: DC

## 2023-08-22 NOTE — Progress Notes (Signed)
 Established Patient Office Visit  Subjective  Patient ID: Shannon Clarke, female    DOB: 24-Mar-1975  Age: 48 y.o. MRN: [id number]  Chief Complaint  Patient presents with   Hospitalization Follow-up    HPI F/U from ED visit at [facility] on 6/17 for feeling down. Had recently lost her mother earlier this year. She is here with her husband today.  She also recently had a virtual affair and gave money to the scammer.  Her husband feel like there eis really something wrong. He says she is not herself.  She has been extremely forgetful.  Her husband said that for example, the heating and air repair person was coming today and within just the 30-minute.  She asked him 6 different times when the repair person was coming.  She has been extremely tearful with outbursts of tearfulness on a daily basis.  Having more frequent headaches.  She is also been experiencing some right sided jaw pain just in front of her ear.  She actually went to her dentist just to have it checked out they said it was not dental.  Labs in ED shows some abnormalities.    He also lost her job recently.  She is not sleeping consistently and often times she will go to bed after midnight she will kind of drift in and out of sleep until about 4:30 in the morning when she finally falls solidly to sleep and then will sometimes sleep into the morning.  She does not drink caffeine.  Flowsheet Row Office Visit from 08/22/2023 in [facility] Primary Care & Sports Medicine at [facility]  PHQ-9 Total Score 11      08/22/2023   11:58 AM 09/18/2021    2:12 PM 05/08/2019    2:56 PM 02/04/2017    1:36 PM  GAD 7 : Generalized Anxiety Score  Nervous, Anxious, on Edge 1 1 0 3  Control/stop worrying 1 1 0 2  Worry too much - different things 0 1 0 2  Trouble relaxing 0 0 0 0  Restless 0 0 0 0  Easily annoyed or irritable 0 0 0 0  Afraid - awful might happen 0 0 0 1  Total GAD 7 Score 2 3 0 8  Anxiety Difficulty Somewhat  difficult Not difficult at all Not difficult at all          ROS    Objective:     BP (!) 188/76   Pulse (!) 111   Ht 5' 1 (1.549 m)   Wt 224 lb 1.9 oz (101.7 kg)   SpO2 95%   BMI 42.35 kg/m    Physical Exam Vitals and nursing note reviewed.  Constitutional:      Appearance: Normal appearance.  HENT:     Head: Normocephalic and atraumatic.     Right Ear: Ear canal and external ear normal.     Left Ear: Tympanic membrane, ear canal and external ear normal.     Ears:     Comments: TM is dull with absent light reflex difficult to visualize the ossicle.  Eyes:     Conjunctiva/sclera: Conjunctivae normal.    Cardiovascular:     Rate and Rhythm: Normal rate and regular rhythm.  Pulmonary:     Effort: Pulmonary effort is normal.     Breath sounds: Normal breath sounds.   Skin:    General: Skin is warm and dry.   Neurological:     Mental Status: She is alert.  Psychiatric:        Mood and Affect: Mood normal.      No results found for any visits on 08/22/23.    The ASCVD Risk score (Arnett DK, et al., 2019) failed to calculate for the following reasons:   Cannot find a previous HDL lab   Cannot find a previous total cholesterol lab    Assessment & Plan:   Problem List Items Addressed This Visit       Cardiovascular and Mediastinum   Essential hypertension   When I saw her in February she was having some vertigo issues at the time her blood pressure was around 140 but today it is in the 180s she has never been on medication before so I did go ahead and to start losartan 25 mg daily.      Relevant Medications   losartan (COZAAR) 25 MG tablet     Other   MDD (major depressive disorder), single episode   PHQ-9 score of 11 today.  They could go ahead and start her on 10 mg of fluoxetine  in the emergency room.  I am going to go ahead and bump her up to 20 mg.  She has her first consultation for therapy on Thursday which is fantastic.  She has some  unusual symptoms that are outside of what we would typically see with depression she has never been on medication for her mood before.  I do think it is worth working up further with brain imaging.  Recent thyroid level in the ED was normal.      Relevant Medications   FLUoxetine  (PROZAC ) 20 MG capsule   Other Visit Diagnoses       Frequent headaches    -  Primary   Relevant Medications   FLUoxetine  (PROZAC ) 20 MG capsule   Other Relevant Orders   CMP14+EGFR   CBC with Differential/Platelet   Transferrin   Hemoglobin A1c   PTH, Intact and Calcium   MR Brain W Wo Contrast     Hypokalemia       Relevant Orders   CMP14+EGFR   CBC with Differential/Platelet   Transferrin   Hemoglobin A1c   PTH, Intact and Calcium   MR Brain W Wo Contrast     Hypercalcemia       Relevant Orders   CMP14+EGFR   CBC with Differential/Platelet   Transferrin   Hemoglobin A1c   PTH, Intact and Calcium     Abnormal urine       Relevant Orders   CMP14+EGFR   CBC with Differential/Platelet   Transferrin   Hemoglobin A1c   PTH, Intact and Calcium     Tearfulness       Relevant Orders   CMP14+EGFR   CBC with Differential/Platelet   Transferrin   Hemoglobin A1c   PTH, Intact and Calcium     Forgetfulness       Relevant Orders   CMP14+EGFR   CBC with Differential/Platelet   Transferrin   Hemoglobin A1c   PTH, Intact and Calcium   MR Brain W Wo Contrast     Primary hypertension       Relevant Medications   losartan (COZAAR) 25 MG tablet     Jaw pain       Relevant Medications   fluticasone  (FLONASE ) 50 MCG/ACT nasal spray     Sleep disturbance       Relevant Orders   MR Brain W Wo Contrast      Right TM dullness  with right jaw pain-recommend a trial of nasal steroid spray.  Will see if symptoms improve over the next week or 2.  Tympanometry showed widened curved in that right ear.  So we will treat with fluticasone  and see if improves over the next 2 to 3 weeks.  Headaches-no prior  history of migraines but she has been having more frequent headaches with recent personality changes I think it is absolutely worthwhile to do some brain imaging for further workup.  And also with increased forgetfulness.  Disturbance-she also has had a significant shift in her sleep cycle we discussed setting a bedtime around 145 with the idea of going to sleep around 2 AM and then setting an alarm to wake up at 8 hours later even if she has not slept well and doing that consistently for 2 to 3 weeks.  If sleep pattern starts to get more consistent and okay to move bedtime forward by about 15 minutes continue to avoid caffeine.  Tearful outbursts-again unclear if this is related to recent ramp-up in depressive symptoms but again I am concerned about it being an indication more personality changes.  Or more complicated underlying mood disorder.  She does have a therapy appointment later this week some hopeful that that will be revealing as they would likely be able to have more time to spend with her in conversation.  Return in about 4 months (around 12/22/2023) for Mood.    Dorothyann Byars, MD

## 2023-08-22 NOTE — Assessment & Plan Note (Signed)
 When I saw her in February she was having some vertigo issues at the time her blood pressure was around 140 but today it is in the 180s she has never been on medication before so I did go ahead and to start losartan 25 mg daily.

## 2023-08-22 NOTE — Assessment & Plan Note (Signed)
 PHQ-9 score of 11 today.  They could go ahead and start her on 10 mg of fluoxetine  in the emergency room.  I am going to go ahead and bump her up to 20 mg.  She has her first consultation for therapy on Thursday which is fantastic.  She has some unusual symptoms that are outside of what we would typically see with depression she has never been on medication for her mood before.  I do think it is worth working up further with brain imaging.  Recent thyroid level in the ED was normal.

## 2023-08-22 NOTE — Telephone Encounter (Signed)
 Let patient know that there was a slight difference in that right ear with the pressure test that we did going to send over a nasal steroid spray called fluticasone  I want her to start using that daily for the next 2 to 3 weeks to see if that is helping with the right sided jaw pain.

## 2023-08-23 NOTE — Telephone Encounter (Signed)
Attempted call to patient. Mail box full. Could not leave a voice mail message.  

## 2023-08-24 ENCOUNTER — Ambulatory Visit: Payer: Self-pay | Admitting: Family Medicine

## 2023-08-24 LAB — CMP14+EGFR
ALT: 37 IU/L — ABNORMAL HIGH (ref 0–32)
AST: 33 IU/L (ref 0–40)
Albumin: 4.6 g/dL (ref 3.9–4.9)
Alkaline Phosphatase: 79 IU/L (ref 44–121)
BUN/Creatinine Ratio: 7 — ABNORMAL LOW (ref 9–23)
BUN: 7 mg/dL (ref 6–24)
Bilirubin Total: 0.4 mg/dL (ref 0.0–1.2)
CO2: 20 mmol/L (ref 20–29)
Calcium: 10 mg/dL (ref 8.7–10.2)
Chloride: 103 mmol/L (ref 96–106)
Creatinine, Ser: 0.95 mg/dL (ref 0.57–1.00)
Globulin, Total: 2.3 g/dL (ref 1.5–4.5)
Glucose: 141 mg/dL — ABNORMAL HIGH (ref 70–99)
Potassium: 3.2 mmol/L — ABNORMAL LOW (ref 3.5–5.2)
Sodium: 142 mmol/L (ref 134–144)
Total Protein: 6.9 g/dL (ref 6.0–8.5)
eGFR: 75 mL/min/{1.73_m2} (ref >59)

## 2023-08-24 LAB — CBC WITH DIFFERENTIAL/PLATELET
Basophils Absolute: 0.1 10*3/uL (ref 0.0–0.2)
Basos: 1 %
EOS (ABSOLUTE): 0.1 10*3/uL (ref 0.0–0.4)
Eos: 1 %
Hematocrit: 46.3 % (ref 34.0–46.6)
Hemoglobin: 15.3 g/dL (ref 11.1–15.9)
Immature Grans (Abs): 0 10*3/uL (ref 0.0–0.1)
Immature Granulocytes: 0 %
Lymphocytes Absolute: 1.9 10*3/uL (ref 0.7–3.1)
Lymphs: 28 %
MCH: 28.7 pg (ref 26.6–33.0)
MCHC: 33 g/dL (ref 31.5–35.7)
MCV: 87 fL (ref 79–97)
Monocytes Absolute: 0.5 10*3/uL (ref 0.1–0.9)
Monocytes: 8 %
Neutrophils Absolute: 4.4 10*3/uL (ref 1.4–7.0)
Neutrophils: 62 %
Platelets: 364 10*3/uL (ref 150–450)
RBC: 5.34 x10E6/uL — ABNORMAL HIGH (ref 3.77–5.28)
RDW: 13.3 % (ref 11.7–15.4)
WBC: 7 10*3/uL (ref 3.4–10.8)

## 2023-08-24 LAB — HEMOGLOBIN A1C
Est. average glucose Bld gHb Est-mCnc: 111 mg/dL
Hgb A1c MFr Bld: 5.5 % (ref 4.8–5.6)

## 2023-08-24 LAB — PTH, INTACT AND CALCIUM: PTH: 22 pg/mL (ref 15–65)

## 2023-08-24 LAB — TRANSFERRIN: Transferrin: 329 mg/dL (ref 192–364)

## 2023-08-24 NOTE — Progress Notes (Signed)
 Hi Shannon Clarke, potassium was still a little bit low at 3.2 normals between 3-1/2 and 5..  Your liver enzymes is slightly elevated as well.  Hemoglobin looks good no sign of anemia.  No sign of diabetes.  Parathyroid hormone level is normal.  I do not have a good reason for your potassium to be low.  Has that happened before.  Would like to recheck that in about 2 weeks and if it still low then we may need to put you on a potassium supplement.  In the short-term you can work on eating potassium rich foods.  I did move forward with ordering the MRI so hopefully they are getting that scheduled for next week.

## 2023-08-29 NOTE — Telephone Encounter (Signed)
 Please sent my chart or mai;l letter

## 2023-08-30 ENCOUNTER — Ambulatory Visit

## 2023-08-30 ENCOUNTER — Telehealth: Payer: Self-pay

## 2023-08-30 ENCOUNTER — Ambulatory Visit: Payer: Self-pay

## 2023-08-30 DIAGNOSIS — R519 Headache, unspecified: Secondary | ICD-10-CM | POA: Diagnosis not present

## 2023-08-30 DIAGNOSIS — E876 Hypokalemia: Secondary | ICD-10-CM

## 2023-08-30 DIAGNOSIS — R6889 Other general symptoms and signs: Secondary | ICD-10-CM | POA: Diagnosis not present

## 2023-08-30 DIAGNOSIS — G479 Sleep disorder, unspecified: Secondary | ICD-10-CM | POA: Diagnosis not present

## 2023-08-30 MED ORDER — GADOBUTROL 1 MMOL/ML IV SOLN
10.0000 mL | Freq: Once | INTRAVENOUS | Status: AC | PRN
  Administered 2023-08-30: 10 mL via INTRAVENOUS

## 2023-08-30 NOTE — Telephone Encounter (Signed)
 Spoke with radiologist directly.  Tried to call patient herself voicemail was full.  Called her husband who is listed as her alternate contact and had to leave a message to return call.

## 2023-08-30 NOTE — Progress Notes (Signed)
 Hi Shannon Clarke I tried to call you but your voicemail was full and I tried to call your husband's number and left him a message I really like to discuss these results.

## 2023-08-30 NOTE — Telephone Encounter (Signed)
 Called [facility], spoke Tysa to notify of critical CRM and for PCP to call back.

## 2023-08-30 NOTE — Telephone Encounter (Signed)
 Received a call from radiology - [phone] Calling to have provider review critical lab results of Brain MRI-

## 2023-08-30 NOTE — Telephone Encounter (Signed)
 FYI Only or Action Required?: Action required by provider: Call [place] Radiologist Dr. Shona  .  Patient was last seen in primary care on 08/22/2023 by Alvan Dorothyann BIRCH, MD. Called Nurse Triage reporting Advice Only.   Triage Disposition: Call PCP Now  Patient/caregiver understands and will follow disposition?: Yes  Copied from CRM [id number]. Topic: Clinical - Lab/Test Results >> Aug 30, 2023 12:27 PM Kevelyn M wrote: Reason for CRM: STAT lab imaging results (MRI), Aloha, calling from Dr. Milford office. Reason for Disposition  Lab or radiology calling with CRITICAL test results  Answer Assessment - Initial Assessment Questions 1. REASON FOR CALL or QUESTION: What is your reason for calling today? or How can I best help you? or What question do you have that I can help answer?    Dr. Shona is wanting to discuss critical findings with Dr. Alvan and she call give [phone].  Protocols used: PCP Call - No Triage-A-AH

## 2023-08-31 ENCOUNTER — Telehealth: Payer: Self-pay

## 2023-08-31 ENCOUNTER — Other Ambulatory Visit: Payer: Self-pay | Admitting: Family Medicine

## 2023-08-31 DIAGNOSIS — R9089 Other abnormal findings on diagnostic imaging of central nervous system: Secondary | ICD-10-CM

## 2023-08-31 DIAGNOSIS — R519 Headache, unspecified: Secondary | ICD-10-CM

## 2023-08-31 NOTE — Progress Notes (Signed)
 Orders Placed This Encounter  Procedures   ECHOCARDIOGRAM COMPLETE BUBBLE STUDY    Scattered microvascular hemorrhages in the cerebrum evaluating for PFO.    Standing Status:   Future    Expiration Date:   08/30/2024    Where should this test be performed:   [facility]    Perflutren DEFINITY (image enhancing agent) should be administered unless hypersensitivity or allergy exist:   Administer Perflutren    Reason for exam:   Stroke 434.91 / I63.9

## 2023-08-31 NOTE — Telephone Encounter (Signed)
 Copied from CRM [id number]. Topic: Clinical - Medical Advice >> Aug 30, 2023  5:14 PM Kevelyn M wrote: Reason for CRM: Patient's husband calling Dr. Alvan back about Brain MRI that was critical.

## 2023-08-31 NOTE — Telephone Encounter (Signed)
 Dr. Lynnie spoke with Dr. Shona regarding results on 07/01/225

## 2023-08-31 NOTE — Telephone Encounter (Signed)
 Copied from CRM [id number]. Topic: Clinical - Lab/Test Results >> Aug 30, 2023 12:27 PM Kevelyn M wrote: Reason for CRM: STAT lab imaging results (MRI), Aloha, calling from Dr. Milford office. >> Aug 31, 2023  8:08 AM Alfonso ORN wrote: Patient received a call back regarding her brain scan results  >> Aug 30, 2023  4:43 PM Graeme ORN wrote: Aloha called back from Radiology. Had not heard back from provider. Dr. Shona wanted him to call back and follow up. Connected to [facility]

## 2023-08-31 NOTE — Progress Notes (Signed)
 Spoke with her husband will about the results today.  They are going to check blood pressure at home so that we can make an adjustment to the blood pressure regimen and also would like to get them back here to recheck the blood pressure in office as well it is crucially important that we get that under control ASAP.  Also will place urgent neurology referral to get her in.  She is can also need further restratification I think she would benefit from an echocardiogram as well to make sure that she does not have a PFO.

## 2023-08-31 NOTE — Telephone Encounter (Signed)
 Copied from CRM [id number]. Topic: Clinical - Medical Advice >> Aug 30, 2023  5:18 PM Kevelyn M wrote: Reason for CRM: Patient's husband calling Dr. Alvan back about Brain MRI that was critical. Call back patient's husband. [phone]

## 2023-08-31 NOTE — Telephone Encounter (Signed)
 Copied from CRM [id number]. Topic: Clinical - Lab/Test Results >> Aug 30, 2023 12:27 PM Kevelyn M wrote: Reason for CRM: STAT lab imaging results (MRI), Aloha, calling from Dr. Milford office.

## 2023-08-31 NOTE — Telephone Encounter (Signed)
 Dr. Alvan spoke with patient's husband regarding results this morning 7/2/225.

## 2023-08-31 NOTE — Telephone Encounter (Signed)
 Dr. Alvan spoke with pateint's husband this morning 08/31/2023 regarding results.

## 2023-09-01 NOTE — Telephone Encounter (Signed)
Pt had been made aware

## 2023-09-03 NOTE — Progress Notes (Signed)
 [facility] Case Management Discharge Note   Patient:   Shannon Clarke MR Number:  [id number] Patient Date of Birth: August 09, 1975 Age/Sex:  48 y.o./female   Discharge Assessment   Facility/Agency Type: Home care, Home, no needs  Home Health Services/Durable Medical Equipment at Discharge             Discharge Assistance     Discharge Transportation: Private vehicle   Pt will dc home with spouse in private vehicle. Pt here with no insurance- CM faxed medication voucher to [employer].   No other CM needs for DC.   Electronically signed: Margean LITTIE Mirza, MSW 09/03/2023 12:23 PM

## 2023-09-03 NOTE — Nursing Note (Signed)
 IV DC'd and no tele. AVS gone over and given to pt and pts husband at bedside. Pt voucher for meds sent by case management. Husband needed prescriptions sent to [facility] on [address] not the one listed on AVS. So MD resent prescriptions to that [facility] and voucher was sent to that [facility] as well. Pt ok to leave per case management and MD. Husband at bedside will be pts ride home and pick up meds. Secretary took pt down in wheelchair for discharge.

## 2023-09-03 NOTE — Discharge Summary (Signed)
 [facility]  [facility] Inpatient Discharge Summary  PCP: Dorothyann JONETTA Byars, MD Discharge Details   Admit date:         08/31/2023 Discharge date:        09/03/2023  Hospital Days:    3 days  Code Status:   Full Code Advanced Directives on file: No Directive        Discharge Diagnoses:  Principal Problem:   Acute cerebrovascular accident (CVA) (*) Active Problems:   GERD (gastroesophageal reflux disease)   CVA (cerebral vascular accident) (*)   Hypertensive urgency   Depression   Dyslipidemia   Task list for follow-up:    Follow-Up Appointments Suggested: Ambulatory referral to Speech Therapy   Acute strokes  Dorothyann JONETTA Byars, MD [address] [place] [address] [phone]     Ambulatory referral to Neurology     Follow-up with Primary Care Physician   Hospital follow up for encephalopathy related to uncontrolled Hypertension.  Follow-Up Appointments Already Scheduled: No future appointments.  Discharge Medications:   Current Discharge Medication List     START taking these medications      Details  aspirin EC tablet Commonly known as: ECOTRIN LOW DOSE Start taking on: September 04, 2023  Take one tablet (81 mg dose) by mouth daily for 30 days. Quantity: 30 tablet   atorvastatin 80 mg tablet Commonly known as: LIPITOR  Take one tablet (80 mg dose) by mouth at bedtime. Quantity: 30 tablet   hydrALAzine HCl 25 mg tablet Commonly known as: APRESOLINE  Take one tablet (25 mg dose) by mouth every 8 (eight) hours. Indication: High Blood Pressure Quantity: 120 tablet   labetalol HCl 300 mg tablet Commonly known as: NORMODYNE  Take one tablet (300 mg dose) by mouth every 12 (twelve) hours. Quantity: 60 tablet       CONTINUE these medications which have CHANGED      Details  amLODIPine besylate 10 mg tablet Commonly known as: NORVASC Start taking on: September 04, 2023 What  changed:  medication strength how much to take  Take one tablet (10 mg dose) by mouth daily. Indication: High Blood Pressure Quantity: 30 tablet   FLUoxetine  20 mg capsule Commonly known as: PROZAC  What changed: when to take this  Take one capsule (20 mg dose) by mouth at bedtime. Indication: Depression Quantity: 30 capsule       CONTINUE these medications which have NOT CHANGED      Details  fluticasone  propionate 50 mcg/actuation nasal spray Commonly known as: FLONASE   two sprays by Both Nostrils route daily. Indication: Allergic Rhinitis Quantity: 16 g   pantoprazole  sodium 40 mg tablet Commonly known as: PROTONIX  Start taking on: September 04, 2023  Take one tablet (40 mg dose) by mouth daily. Indication: REFRACTORY GERD Quantity: 30 tablet      * You might also be taking other medications not listed above. If you have questions about any of your other medications, talk to the person who prescribed them or your Primary Care Provider.          STOP taking these medications    guaiFENesin 600 mg 12 hr tablet Commonly known as: MUCINEX   losartan  potassium 25 mg tablet Commonly known as: COZAAR    pseudoephedrine-guaifenesin 60-600 MG per tablet Commonly known as: DECONSAL,MUCINEX D        Allergies: Allergies[1]  Consultations this Admission: IP CONSULT TO STROKE NAVIGATOR/COORDINATOR IP CONSULT TO NEUROLOGY IP CONSULT TO CASE  MANAGEMENT, RN/SW IP CONSULT TO CARDIOLOGY  Procedures/Imaging:     MRA Angio Head WO Contrast  Final Result  IMPRESSION:  Normal intracranial MR angiography.          Electronically Signed by: Dallas Jubilee, MD on 09/01/2023 3:03 PM    EEG Awake and Drowsy  Final Result  This EEG is ordered to evaluate for seizures.      Medications:  MAR was reviewed.    EEG Start and End Date and Time: September 01, 2023 at 1047 - 1108  Total Recording Time: 21:02    Description:    This is a 21- channel video EEG recording with one  channel devoted to   limited EKG recording.  Electrodes were placed in accordance with the   International 10-20 system and various montages were utilized.  T1 and T2   electrodes are added.  The recording was performed with a sensitivity of 7   V/mm, high frequency filter of 70 Hz and low frequency filter of 1 Hz.      The patient's state of consciousness is characterized as awake and drowsy.    The posterior dominant rhythm consisted of a frequency of 10 Hz with an   amplitude ranging between 20-40 V.  The background is well organized,   continuous, symmetric across the bilateral posterior derivations, and   attenuated with eye opening.  No stage II sleep architecture was seen.    Photic stimulation did not demonstrate any abnormalities.    Hyperventilation did not provoke any focal changes or abnormalities.    Interpretation:  This is normal awake and drowsy EEG.    Clinical Correlation: While a normal EEG does not rule out epilepsy, there   was no evidence of an underlying seizure disorder on this study.      - Read by Prentice NOVAK. Janit, MD   09/01/2023, 12:45 PM             Echocardiogram Complete WO Enhancing Agent  Final Result  Left Ventricle: There is moderate hypertrophy.  .  Left Ventricle: Systolic function is normal. EF: 55-60%.  .  Left Ventricle: Doppler parameters consistent with mild diastolic   dysfunction and low to normal LA pressure.  .  Aortic Valve: Mild aortic valve regurgitation.  .  Left Atrium: Injection of agitated saline documents no interatrial   shunt.      US  Carotid Bilateral  Final Result  IMPRESSION:  1.  No hemodynamically significant stenosis on either side.    2.  Both vertebral arteries are patent with antegrade flow.    ___________________________________________________________  Degree of stenosis is determined using NASCET measurement technique:  Severe:  70-90%  Moderate:  50-69%  Mild:  Less than 50%    Normal:  PSV < 125  cm/s with no visible plaque  Less than 50% stenosis: PSV < 180 cm/s   50-69% stenosis: PSV >= 180 cm/s  70-99% stenosis: PSV >= 230 cm/s AND EDV >= 100 cm/s  occluded: No visible flow in the ICA    For an internal carotid stenosis over 50%, a PSV >=180 cm/s has a positive predictive value of approximately 70% and a negative predictive value of approximately 96%.    For an internal carotid stenosis over 70%, a PSV >= 230 cm/s AND an EDV >= 100 cm/s has a positive predictive value of approximately 50% and a negative predictive value of approximately 96%.         Electronically Signed by: Ozell  JONETTA Cordial, MD on 09/01/2023 8:45 AM      Pertinent Labs:  Cardiac Labs: No results for input(s): CK, CKMB, CTNI, BNP in the last 168 hours. CBC: Recent Labs    Units 09/01/23 0502 08/31/23 1531  WBC thou/mcL 9.6 7.9  HGB gm/dL 85.0 83.1*  PLT thou/mcL 385 408*   BMP: Recent Labs    Units 09/03/23 0505 09/02/23 1552 09/02/23 0459 09/01/23 0502 08/31/23 1532  NA mmol/L 138 135* 139 141 141  K mmol/L 3.4* 3.3* 2.8* 2.8* 3.0*  CL mmol/L 105 102 102 104 103  CO2 mmol/L 23 22 24 22 22   BUN mg/dL 19 18 15 13 11   CREATININE mg/dL 8.67* 8.49* 8.47* 8.73* 1.10*   Lipid Panel: Recent Labs    Units 09/01/23 0502  CHOL mg/dL 821  TRIG mg/dL 882  HDL mg/dL 46  LDL mg/dL 890*   Liver Enzymes: Recent Labs    Units 08/31/23 1532  AST U/L 41*  ALT U/L 52*  ALKPHOS U/L 80  BILITOT mg/dL 0.7   Endocrine Panels: Recent Labs    Units 09/03/23 0505 09/02/23 1552 09/02/23 0459 09/01/23 0502 08/31/23 1532 08/31/23 1459  TSH mcIU/mL  --   --   --   --  2.42  --   T4 ng/dL  --   --   --   --  8.42  --   HGBA1C %  --   --   --  5.4  --   --   GLUCOSE mg/dL 893* 887* 887* 877* 869*  130* [facility] Course   Physicians involved in care during this hospitalization Attending Provider: Ethel FORBES Mccreedy, MD Admitting Provider: Ethel FORBES Mccreedy, MD Consulting  Physician: Omega Mutton, DO Consulting Physician: Omega Mutton, DO Consulting Physician: Dwight Paul, MD  HPI per admitting provider:  48 year old female with past medical history significant for hypertension diagnosed in June 2025, GERD, 30 pound weight gain over the course of 1 year, sent to the ED by her primary care provider for abnormal brain MRI.   Patient had been acting unusually slow cognitively for 2 months and her husband initially thought that it was because she was depressed due to her mom's recent death.  However, when it continued, patient was taken to her PCP and brain MRI proved to be abnormal.   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  She could not keep track of her train of thought during these meetings and could not focus.   Brain MRI 08/30/2023 report: Severe abnormal signal has developed in the brain since a 2020  MRI, constellation most consistent with very Severe Chronic Small  Vessel Disease, and there are Three Small Acute white matter lacunar  infarcts superimposed in the frontal lobes.  2. Numerous chronic microhemorrhages in the brain in association  with #1. No acute intracranial hemorrhage or mass effect.   Hospital Course by Problem List:     Acute CVA (3 small lacunar infarcts in her frontal lobe) with numerous chronic microhemorrhages. A1c 5.4 Brain MRA did not show any hemodynamically significant stenosis. Carotid ultrasound did not show any hemodynamically significant stenosis. Speech pathology has evaluated patient and a referral has been made for outpatient speech therapy. PT and OT evaluated patient and did not find any needs. 2D ECHO 09/01/2023: .  Left Ventricle: There is moderate hypertrophy. .  Left Ventricle: Systolic function is normal. EF: 55-60%. .  Left Ventricle: Doppler parameters consistent with mild  diastolic dysfunction and low to normal LA pressure. .  Aortic Valve: Mild aortic valve regurgitation. .  Left Atrium: Injection of agitated saline documents no interatrial shunt. Cardiology consult requested to evaluate patient for heart monitor to complete stroke workup. Continue monotherapy with aspirin 81 mg daily as per neurology's recommendation. Patient neurology follow-up.   Acute intermittent encephalopathy with worsening short and long-term memory-much improved. Patient has severe chronic small vessel disease on her brain MRI Improved since s her blood pressure has been well-controlled. EEG was normal. Outpatient follow-up with her primary care physician.   Hypertensive urgency-resolved.     Primary hypertension-improved control. Needs aggressive blood pressure management since her lacunar infarcts and resulting encephalopathy are clearly related to her previously uncontrolled blood pressure. Regimen consists of labetalol, hydralazine and amlodipine.  Hospital paid for her medications until she has a chance to apply for Medicaid. She will follow-up with her primary care physician.     Dyslipidemia LDL 109 Continue high-dose atorvastatin.     Hypokalemia-repleted prior to discharge. K 2.8 >> 3.4     Acute kidney injury superimposed on CKD stage IIIa Scr 1.1 >> 1.26 >> 1.5 >> 1.32 eGFR 63 >> 54 >> 44 >> 51    Obesity, BMI 38.09-outpatient PCP follow-up    BP 129/86   Pulse 56   Temp 97.6 F (36.4 C) (Oral)   Resp 18   Ht 1.6 m (5' 3)   Wt 97.5 kg (215 lb)   SpO2 91%   BMI 38.09 kg/m   Physical Exam Gen-Alert, oriented x 4, NAD.  Husband at bedside.  Neck-supple, no JVD appreciated. Lungs-CTA bilat with decreased BS at bases. CV-RRR, normal S1S2, no M/G/R appreciated. Abd-soft, obese, NTND, NABS, no HSM/masses appreciated. Ext-no edema, cyanosis or clubbing.  Skin-no rash appreciated. Neuro-No focal motor deficits appreciated.  Gait and reflexes not  tested.  Post Hospital Care   Activity: Activity Instructions     Activity as tolerated         Weight Bearing Status:          Oxygen Orders for Discharge: O2 Device: None (Room air) SpO2: 91 %  Diet: Diet and Nourishment Orders (From admission, onward)     Start       09/03/23 0000  Cardiac diet (heart healthy)          08/31/23 1647  Cardiac diet 2 gm NA  Diet effective now       Question:  Na restriction, if any:  Answer:  2 gm NA              Wound Care Recommendations:    Lines/Drains/Airways: Patient Lines/Drains/Airways Status     Active LDAs     None            Therapy Recommendations:  PT: Anticipated Intensity of Rehab at Next Level of Care: No needs anticipated   Anticipated Caregiver Needs at Next Level of Care: Intermittent assistance DME Equipment Recommendations: No DME needs anticipated for mobility (Pt is not interested in a cane at this time)     AM-PAC Basic Mobility Raw Score (out of 24): 24 Routine Mobility Goal: Walk 250 Feet or More & All ADLs in Bathroom After Gathering Supplies in Room 8  OT: Anticipated Intensity of Rehab at Next Level of Care: No needs anticipated  Anticipated Caregiver Needs at Next Level of Care: Full time indirect   DME Equipment Recommendations: No DME needs anticipated for ADLs  SLP: Anticipated Intensity of Rehab at Next Level of Care: Multiple times per week Anticipated Caregiver Needs at Next Level of Care: Full time indirect   Nutritional Recommendation: Regular diet, Thin Liquids/0 Recommended Form of Meds: Whole Compensatory Swallowing Strategies: None needed  Home Health Orders: DME Orders (From admission, onward)    None      Home Health Agency     None       I spent 39 minutes performing discharge services.   Electronically signed: Ethel FORBES Mccreedy, MD 09/03/2023 / 4:35 PM  *Some images could not be shown.    [1] Allergies Allergen Reactions  .  Levofloxacin Diarrhea and Nausea Only

## 2023-09-05 ENCOUNTER — Telehealth: Payer: Self-pay

## 2023-09-05 NOTE — Transitions of Care (Post Inpatient/ED Visit) (Signed)
   09/05/2023  Name: NAKEYIA MENDEN MRN: [id number] DOB: 29-Oct-1975  Today's TOC FU Call Status: Today's TOC FU Call Status:: Unsuccessful Call (1st Attempt) Unsuccessful Call (1st Attempt) Date: 09/05/23  Attempted to reach the patient regarding the most recent Inpatient/ED visit.  Follow Up Plan: Additional outreach attempts will be made to reach the patient to complete the Transitions of Care (Post Inpatient/ED visit) call.   Shona Prow RN, CCM [place]  VBCI-Population Health RN Care Manager [phone]

## 2023-09-06 ENCOUNTER — Telehealth: Payer: Self-pay

## 2023-09-06 NOTE — Transitions of Care (Post Inpatient/ED Visit) (Signed)
   09/06/2023  Name: Shannon Clarke MRN: [id number] DOB: June 02, 1975  Today's TOC FU Call Status: Today's TOC FU Call Status:: Unsuccessful Call (2nd Attempt) Unsuccessful Call (2nd Attempt) Date: 09/06/23  Attempted to reach the patient regarding the most recent Inpatient/ED visit.  Follow Up Plan: Additional outreach attempts will be made to reach the patient to complete the Transitions of Care (Post Inpatient/ED visit) call.   Shona Prow RN, CCM [place]  VBCI-Population Health RN Care Manager [phone]

## 2023-09-07 ENCOUNTER — Inpatient Hospital Stay: Admitting: Family Medicine

## 2023-09-07 ENCOUNTER — Telehealth: Payer: Self-pay

## 2023-09-07 NOTE — Transitions of Care (Post Inpatient/ED Visit) (Signed)
   09/07/2023  Name: Shannon Clarke MRN: [id number] DOB: Dec 02, 1975  Today's TOC FU Call Status: Today's TOC FU Call Status:: Successful TOC FU Call Completed TOC FU Call Complete Date: 09/07/23 [facility] RN spoke with spouse who voiced concerns and states they are changing PCPs - Safety Zone report completed) Patient's Name and Date of Birth confirmed.  Transition Care Management Follow-up Telephone Call    Items Reviewed:  [facility] RN explained program and purpose of call - Husband declined and said he is in the process of changing PCP for himself and patient to [facility]. TOC RN listened to concerns and completed a safety zone report.  Shona Prow RN, CCM [place]  VBCI-Population Health RN Care Manager [phone]

## 2023-09-26 ENCOUNTER — Telehealth: Payer: Self-pay | Admitting: *Deleted

## 2023-09-26 MED ORDER — HYDRALAZINE HCL 25 MG PO TABS: 25.0000 mg | ORAL_TABLET | Freq: Three times a day (TID) | ORAL | 0 refills | Status: AC

## 2023-09-26 MED ORDER — PANTOPRAZOLE SODIUM 40 MG PO TBEC: 40.0000 mg | DELAYED_RELEASE_TABLET | Freq: Every day | ORAL | 0 refills | Status: AC

## 2023-09-26 MED ORDER — FLUOXETINE HCL 20 MG PO CAPS: 20.0000 mg | ORAL_CAPSULE | Freq: Every day | ORAL | 1 refills | Status: AC

## 2023-09-26 MED ORDER — LABETALOL HCL 300 MG PO TABS: 300.0000 mg | ORAL_TABLET | Freq: Two times a day (BID) | ORAL | 0 refills | Status: AC

## 2023-09-26 MED ORDER — ATORVASTATIN CALCIUM 80 MG PO TABS: 80.0000 mg | ORAL_TABLET | Freq: Every day | ORAL | 0 refills | Status: AC

## 2023-09-26 MED ORDER — AMLODIPINE BESYLATE 10 MG PO TABS: 10.0000 mg | ORAL_TABLET | Freq: Every day | ORAL | 0 refills | Status: AC

## 2023-09-26 NOTE — Telephone Encounter (Signed)
 Copied from CRM [id number]. Topic: Appointments - Scheduling Inquiry for Clinic >> Sep 26, 2023 11:02 AM Diannia H wrote: Reason for CRM: Patient is needing an appointment for her medication refills, she is down to her last couple of pills for a few of her medicines, she has an appointment on 09/02 but she is not sure if she will have enough medicine till then. She was in the hospital the last week of June until July 5th. Could you please assist? Callback number is [phone].

## 2023-09-26 NOTE — Telephone Encounter (Signed)
   I sent the medications that I could see from her discharge summary.  The only one that I was not sure about was the Plavix over the blood thinner.  Does she need that 1 to?  Meds ordered this encounter  Medications   amLODipine  (NORVASC ) 10 MG tablet    Sig: Take 1 tablet (10 mg total) by mouth daily.    Dispense:  90 tablet    Refill:  0   atorvastatin  (LIPITOR) 80 MG tablet    Sig: Take 1 tablet (80 mg total) by mouth at bedtime.    Dispense:  90 tablet    Refill:  0   FLUoxetine  (PROZAC ) 20 MG capsule    Sig: Take 1 capsule (20 mg total) by mouth daily.    Dispense:  90 capsule    Refill:  1   hydrALAZINE  (APRESOLINE ) 25 MG tablet    Sig: Take 1 tablet (25 mg total) by mouth 3 (three) times daily.    Dispense:  270 tablet    Refill:  0   labetalol  (NORMODYNE ) 300 MG tablet    Sig: Take 1 tablet (300 mg total) by mouth 2 (two) times daily.    Dispense:  180 tablet    Refill:  0   pantoprazole  (PROTONIX ) 40 MG tablet    Sig: Take 1 tablet (40 mg total) by mouth daily.    Dispense:  90 tablet    Refill:  0

## 2023-09-26 NOTE — Telephone Encounter (Signed)
 Dr. Alvan, please advise on this if you are okay with us  refilling pt's meds since appt has been scheduled or do you want us  to try to work pt in sooner.

## 2023-10-28 ENCOUNTER — Other Ambulatory Visit: Payer: Self-pay | Admitting: Family Medicine

## 2023-10-28 DIAGNOSIS — I10 Essential (primary) hypertension: Secondary | ICD-10-CM

## 2023-11-01 ENCOUNTER — Ambulatory Visit: Payer: Self-pay | Admitting: Family Medicine

## 2023-11-01 ENCOUNTER — Encounter: Payer: Self-pay | Admitting: Sports Medicine

## 2023-12-06 ENCOUNTER — Encounter (HOSPITAL_COMMUNITY): Payer: Self-pay | Admitting: Family Medicine

## 2023-12-22 ENCOUNTER — Ambulatory Visit: Admitting: Family Medicine

## 2023-12-27 ENCOUNTER — Other Ambulatory Visit: Payer: Self-pay | Admitting: Family Medicine

## 2023-12-27 DIAGNOSIS — I10 Essential (primary) hypertension: Secondary | ICD-10-CM

## 2024-01-01 ENCOUNTER — Other Ambulatory Visit: Payer: Self-pay | Admitting: Family Medicine

## 2024-01-06 ENCOUNTER — Other Ambulatory Visit: Payer: Self-pay | Admitting: Family Medicine

## 2024-01-11 ENCOUNTER — Other Ambulatory Visit: Payer: Self-pay | Admitting: Family Medicine

## 2024-01-31 ENCOUNTER — Other Ambulatory Visit: Payer: Self-pay | Admitting: Family Medicine
